# Patient Record
Sex: Female | Born: 1995 | Race: Asian | Hispanic: No | Smoking: Current every day smoker
Health system: Southern US, Community
[De-identification: ages and names within clinical notes are randomized; demographics above are authoritative.]

## PROBLEM LIST (undated history)

## (undated) DIAGNOSIS — L83 Acanthosis nigricans: Secondary | ICD-10-CM

## (undated) HISTORY — DX: Acanthosis nigricans: L83

---
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
## 2015-12-13 ENCOUNTER — Ambulatory Visit (INDEPENDENT_AMBULATORY_CARE_PROVIDER_SITE_OTHER): Payer: Self-pay | Admitting: Family Medicine

## 2015-12-13 VITALS — BP 112/74 | HR 99 | Temp 98.2°F | Resp 17 | Ht 68.69 in | Wt 181.0 lb

## 2015-12-13 DIAGNOSIS — R102 Pelvic and perineal pain: Secondary | ICD-10-CM | POA: Insufficient documentation

## 2015-12-13 LAB — POCT URINE PREGNANCY: Preg Test, Ur: NEGATIVE

## 2015-12-13 LAB — POC MICROSCOPIC URINALYSIS (UMFC): Mucus: ABSENT

## 2015-12-13 LAB — POCT WET + KOH PREP
Trich by wet prep: ABSENT
YEAST BY KOH: ABSENT
YEAST BY WET PREP: ABSENT

## 2015-12-13 LAB — POCT URINALYSIS DIP (MANUAL ENTRY)
Bilirubin, UA: NEGATIVE
GLUCOSE UA: NEGATIVE
Ketones, POC UA: NEGATIVE
Nitrite, UA: NEGATIVE
PROTEIN UA: NEGATIVE
SPEC GRAV UA: 1.015
UROBILINOGEN UA: 0.2
pH, UA: 7

## 2015-12-13 MED ORDER — METRONIDAZOLE 500 MG PO TABS: 500.0000 mg | ORAL_TABLET | Freq: Two times a day (BID) | ORAL | 0 refills | Status: DC

## 2015-12-13 NOTE — Progress Notes (Signed)
Patient ID: Elizabeth Hanson, female    DOB: [birth year], 20 y.o.   MRN: [id number]  PCP: No PCP Per Patient  Chief Complaint  Patient presents with  . OTHER    Pt c/o lower abd pain during and post intercourse x1week.     Subjective:   HPI 20 year old female presents for evaluation of painful intercourse times one week. She [facility]. Reports a sharp pain worst than cramping during and after intercourse. She is also experiencing pressure during the end of urination. Denies changes in urine color or change in odor. Reports noticing a yellowish discharge for about 1 week. She engages in unprotected sex with multiple partner. Reports last menstrual cycle was around October 21 st.  Social History   Social History  . Marital status: Single    Spouse name: N/A  . Number of children: N/A  . Years of education: N/A   Occupational History  . Not on file.   Social History Main Topics  . Smoking status: Never Smoker  . Smokeless tobacco: Not on file  . Alcohol use No  . Drug use: No  . Sexual activity: Yes    Birth control/ protection: Condom   Other Topics Concern  . Not on file   Social History Narrative  . No narrative on file   Family History  Problem Relation Age of Onset  . Breast cancer Mother   . Pancreatic cancer Paternal Grandmother    Review of Systems See HPI  Patient Active Problem List   Diagnosis Date Noted  . MDD (major depressive disorder), single episode, moderate (HCC) 09/25/2012  . Acanthosis nigricans 06/13/2012     Prior to Admission medications   Medication Sig Start Date End Date Taking? Authorizing Provider  cetirizine (ZYRTEC) 10 MG tablet Take 10 mg by mouth daily.   Yes Historical Provider, MD  fluconazole (DIFLUCAN) 100 MG tablet Take 1 tablet (100 mg total) by mouth once. Repeat if needed Patient not taking: Reported on 12/13/2015 10/08/12   Peyton Najjaravid H Hopper, MD   No Known Allergies     Objective:  Physical  Exam  Constitutional: She is oriented to person, place, and time. She appears well-developed and well-nourished.  HENT:  Head: Normocephalic and atraumatic.  Right Ear: External ear normal.  Left Ear: External ear normal.  Cardiovascular: Normal rate.   Pulmonary/Chest: Effort normal.  Abdominal: Soft. Bowel sounds are normal. There is no rebound and no guarding.  Genitourinary: Vaginal discharge found.  Genitourinary Comments: Normal female external genitalia without lesion. No inguinal lymphadenopathy. Vaginal mucosa is pink and moist without lesions. Cervix is closed with  thicken white discharge, not friable. Tenderness noted during vaginal exam in mid lower pelvic region. No offensive odor noted during exam.  Musculoskeletal: Normal range of motion.  Neurological: She is alert and oriented to person, place, and time.  Skin: Skin is warm and dry.  Psychiatric: She has a normal mood and affect. Her behavior is normal. Judgment and thought content normal.   Vitals:   12/13/15 0820  BP: 112/74  Pulse: 99  Resp: 17  Temp: 98.2 F (36.8 C)     Assessment & Plan:  1. Vaginal pain - POCT Microscopic Urinalysis ([facility]) - POCT urine pregnancy - POCT Wet + KOH Prep - POCT urinalysis dipstick - GC/Chlamydia Probe Am -Urine Culture  Plan: -Clue cells present on wet prep. Treat with Metronidazole 500 mg twice daily for 7 days. -Will follow-up regarding  Chlamydia and Gonorrhea results. [facility] as she has no insurance and would like to defer additional testing due to financial constraints.  Godfrey PickKimberly S. Tiburcio PeaHarris, MSN, FNP-C [facility]

## 2015-12-13 NOTE — Patient Instructions (Addendum)
I will contact you with your remaining  lab test results.  For bacterial vaginosis start Flagyl 500 mg twice daily for 7 days.  IF you received an x-ray today, you will receive an invoice from [facility]. Please contact [facility] at [phone] with questions or concerns regarding your invoice.   IF you received labwork today, you will receive an invoice from [facility]. Please contact [facility] at [phone] with questions or concerns regarding your invoice.   Our billing staff will not be able to assist you with questions regarding bills from these companies.  You will be contacted with the lab results as soon as they are available. The fastest way to get your results is to activate your My Chart account. Instructions are located on the last page of this paperwork. If you have not heard from us regarding the results in 2 weeks, please contact this office.      Bacterial Vaginosis Bacterial vaginosis is an infection of the vagina. It happens when too many germs (bacteria) grow in the vagina. This infection puts you at risk for infections from sex (STIs). Treating this infection can lower your risk for some STIs. You should also treat this if you are pregnant. It can cause your baby to be born early. Follow these instructions at home: Medicines  Take over-the-counter and prescription medicines only as told by your doctor.  Take or use your antibiotic medicine as told by your doctor. Do not stop taking or using it even if you start to feel better. General instructions  If you your sexual partner is a woman, tell her that you have this infection. She needs to get treatment if she has symptoms. If you have a female partner, he does not need to be treated.  During treatment:  Avoid sex.  Do not douche.  Avoid alcohol as told.  Avoid breastfeeding as told.  Drink enough fluid to keep your pee (urine) clear or pale yellow.  Keep your  vagina and butt (rectum) clean.  Wash the area with warm water every day.  Wipe from front to back after you use the toilet.  Keep all follow-up visits as told by your doctor. This is important. Preventing this condition  Do not douche.  Use only warm water to wash around your vagina.  Use protection when you have sex. This includes:  Latex condoms.  Dental dams.  Limit how many people you have sex with. It is best to only have sex with the same person (be monogamous).  Get tested for STIs. Have your partner get tested.  Wear underwear that is cotton or lined with cotton.  Avoid tight pants and pantyhose. This is most important in summer.  Do not use any products that have nicotine or tobacco in them. These include cigarettes and e-cigarettes. If you need help quitting, ask your doctor.  Do not use illegal drugs.  Limit how much alcohol you drink. Contact a doctor if:  Your symptoms do not get better, even after you are treated.  You have more discharge or pain when you pee (urinate).  You have a fever.  You have pain in your belly (abdomen).  You have pain with sex.  Your bleed from your vagina between periods. Summary  This infection happens when too many germs (bacteria) grow in the vagina.  Treating this condition can lower your risk for some infections from sex (STIs).  You should also treat this if you are pregnant. It can cause  early (premature) birth.  Do not stop taking or using your antibiotic medicine even if you start to feel better. This information is not intended to replace advice given to you by your health care provider. Make sure you discuss any questions you have with your health care provider. Document Released: 10/24/2007 Document Revised: 09/30/2015 Document Reviewed: 09/30/2015 Elsevier Interactive Patient Education  2017 [employer].

## 2015-12-14 ENCOUNTER — Telehealth: Payer: Self-pay | Admitting: Family Medicine

## 2015-12-14 LAB — GC/CHLAMYDIA PROBE AMP
CT PROBE, AMP APTIMA: DETECTED — AB
GC PROBE AMP APTIMA: NOT DETECTED

## 2015-12-14 MED ORDER — AZITHROMYCIN 250 MG PO TABS: ORAL_TABLET | ORAL | 0 refills | Status: DC

## 2015-12-14 NOTE — Telephone Encounter (Signed)
I left a generic voicemail advising of medication ordered for related to test results.   If patient calls back, her STD testing revealed a positive Chlamydia infection. I am ordering Azithromycin 1 gram. Patient will need to return in 3 months for retest.

## 2015-12-14 NOTE — Telephone Encounter (Signed)
Patient called back and was advised.  she also states that [facility] and given azithromyacin.

## 2015-12-15 ENCOUNTER — Other Ambulatory Visit: Payer: Self-pay | Admitting: Family Medicine

## 2015-12-15 DIAGNOSIS — N390 Urinary tract infection, site not specified: Secondary | ICD-10-CM

## 2015-12-15 LAB — URINE CULTURE

## 2015-12-15 MED ORDER — NITROFURANTOIN MONOHYD MACRO 100 MG PO CAPS: 100.0000 mg | ORAL_CAPSULE | Freq: Two times a day (BID) | ORAL | 0 refills | Status: DC

## 2015-12-15 NOTE — Progress Notes (Unsigned)
Please call patient to advise her urine culture grew out Staph infection. I am sending over a prescription for nitrofurantoin 100 mg twice daily for 10 days to clear infection. I would like for patient to return 1 week after completing antibiotic to have urine retested and cultured.  Thanks,  Joaquin CourtsKimberly Roberth Berling, FNP-C    This can be a lab visit only. I do not need to see her.  Thanks!

## 2015-12-18 ENCOUNTER — Telehealth: Payer: Self-pay | Admitting: Emergency Medicine

## 2015-12-18 NOTE — Progress Notes (Signed)
Pt given lab results with instructions to take prescribed antibiotics for Staph infection  States, she does not have insurance and wants to come in only for urine retest/cx  This should be fine or do you need to see her?

## 2015-12-25 ENCOUNTER — Ambulatory Visit (INDEPENDENT_AMBULATORY_CARE_PROVIDER_SITE_OTHER): Payer: Self-pay | Admitting: Physician Assistant

## 2015-12-25 DIAGNOSIS — R21 Rash and other nonspecific skin eruption: Secondary | ICD-10-CM

## 2015-12-25 DIAGNOSIS — B370 Candidal stomatitis: Secondary | ICD-10-CM

## 2015-12-25 LAB — POCT SKIN KOH: Skin KOH, POC: POSITIVE

## 2015-12-25 MED ORDER — CLOTRIMAZOLE 10 MG MT TROC: 10.0000 mg | Freq: Every day | OROMUCOSAL | 0 refills | Status: AC

## 2015-12-25 NOTE — Patient Instructions (Addendum)
Take clotrimazole troches for 14 days. If symptoms persist after this, please follow up. For rash on chest, use benedryl as needed for itching this should resolve over the next two weeks. If this gets worse or does not go away, follow up in 2 weeks. Thank you for letting me participate in your health and well being.    Oral Thrush, Adult Oral thrush is an infection in your mouth and throat. It causes white patches on your tongue and in your mouth. Follow these instructions at home: Helping with soreness  To lessen your pain:  Drink cold liquids, like water and iced tea.  Eat frozen ice pops or frozen juices.  Eat foods that are easy to swallow, like gelatin and ice cream.  Drink from a straw if the patches in your mouth are painful. General instructions  Take or use over-the-counter and prescription medicines only as told by your doctor. Medicine for oral thrush may be something to swallow, or it may be something to put on the infected area.  Eat plain yogurt that has live cultures in it. Read the label to make sure.  If you wear dentures:  Take out your dentures before you go to bed.  Brush them well.  Soak them in a denture cleaner.  Rinse your mouth with warm salt-water many times a day. To make the salt-water mixture, completely dissolve 1/2-1 teaspoon of salt in 1 cup of warm water. Contact a doctor if:  Your problems are getting worse.  Your problems do not get better in less than 7 days with treatment.  Your infection is spreading. This may show as white patches on the skin outside of your mouth.  You are nursing your baby and you have redness and pain in the nipples. This information is not intended to replace advice given to you by your health care provider. Make sure you discuss any questions you have with your health care provider. Document Released: 04/10/2009 Document Revised: 10/09/2015 Document Reviewed: 10/09/2015 Elsevier Interactive Patient Education   2017 [employer]

## 2015-12-25 NOTE — Progress Notes (Signed)
MRN: [id number] DOB: 01/29/1996  Subjective:   Elizabeth Hanson is a 20 y.o. female presenting for chief complaint of follow up on vaginal issues. Notes that all of her symptoms have resolved. Denies urinary frequency, urinary urgency, dysuria, urinary hesitancy, vaginal discharge,and  vaginal itching.  Pt originally seen in our office on 12/13/15 for dyspareunia and vaginal discharge. Her wet prep showed clue cells. Her urinalysis showed leukocytes and WBCs. And her GC/Chlamydia probe was positive for chlamydia. Pt was treated with 7 day course of flagyl for BV, 1g of azithromycin for chlamydia, and macrobid for UTI.   Of note, pt is concerned about new rash on chest and arms. Notes that this came about 2 days after taking the azithromycin and flagyl. 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 It had associated itching but that has since resolved. Denies pain, trouble swallowing, difficulty breathing and facial swelling. Has not tried anything for relief. Denies new detergent, lotions, soaps, or plant exposure.  She is also having a lot of white stuff on her tongue x 4-5 days. She tried to brush it off with no success.   Of note, she was tested [facility] last week after our office visit because she has no insurance but she has not received her results yet.  Elizabeth Hanson has a current medication list which includes the following prescription(s): azithromycin, cetirizine, metronidazole, and nitrofurantoin (macrocrystal-monohydrate). Also has No Known Allergies.  Elizabeth Hanson  has a past medical history of Acanthosis nigricans. Also  has no past surgical history on file.   Objective:   Vitals: LMP 11/18/2015   Physical Exam  Constitutional: She is oriented to person, place, and time. She appears well-developed and well-nourished.  HENT:  Head: Normocephalic and atraumatic.    Mouth/Throat: Uvula is midline and mucous membranes are normal. Posterior oropharyngeal erythema present.  Creamy white lesions noted on soft palate and tongue. They are easily scraped off with tongue depressor.   Eyes: Conjunctivae are normal.  Neck: Normal range of motion.  Pulmonary/Chest: Effort normal.  Lymphadenopathy:       Head (right side): No submental, no submandibular, no tonsillar, no preauricular, no posterior auricular and no occipital adenopathy present.       Head (left side): No submental, no submandibular, no tonsillar, no preauricular, no posterior auricular and no occipital adenopathy present.    She has no cervical adenopathy.       Right: No supraclavicular adenopathy present.       Left: No supraclavicular adenopathy present.  Neurological: She is alert and oriented to person, place, and time.  Skin: Skin is warm and dry.  Diffuse maculopapular rash on anterior trunk and arms bilaterally.   Psychiatric: She has a normal mood and affect.  Vitals reviewed.    Results for orders placed or performed in visit on 12/25/15 (from the past 24 hour(s))  POCT Skin KOH     Status: None   Collection Time: 12/25/15 10:15 AM  Result Value Ref Range   Skin KOH, POC Positive    Assessment and Plan :   1. Rash and nonspecific skin eruption -Resolving. Informed pt that I am not going to add azithromycin to the allergy list at this itme as she did eat snow crab the day before the rash appeared and has had a history of seafood allergies but she should  inform future providers that she may have had a reaction to azithromycin. Pt understands. Instructed to follow up in 2 weeks if rash has not completely resolved. Follow up sooner if symptoms worsen.   2. Oral thrush - POCT Skin KOH - clotrimazole (MYCELEX) 10 MG troche; Take 1 tablet (10 mg total) by mouth 5 (five) times daily.  Dispense: 70 tablet; Refill: 0   Benjiman CoreBrittany Jackquline Branca, PA-C [facility] [phone] 12/25/2015 10:15 AM

## 2016-01-11 ENCOUNTER — Ambulatory Visit (INDEPENDENT_AMBULATORY_CARE_PROVIDER_SITE_OTHER): Payer: Self-pay | Admitting: Physician Assistant

## 2016-01-11 VITALS — BP 126/74 | HR 91 | Temp 98.1°F | Ht 63.5 in | Wt 180.1 lb

## 2016-01-11 DIAGNOSIS — R21 Rash and other nonspecific skin eruption: Secondary | ICD-10-CM

## 2016-01-11 DIAGNOSIS — N941 Unspecified dyspareunia: Secondary | ICD-10-CM

## 2016-01-11 DIAGNOSIS — B370 Candidal stomatitis: Secondary | ICD-10-CM

## 2016-01-11 LAB — POCT SKIN KOH: SKIN KOH, POC: NEGATIVE

## 2016-01-11 LAB — POCT URINALYSIS DIP (MANUAL ENTRY)
BILIRUBIN UA: NEGATIVE
BILIRUBIN UA: NEGATIVE
GLUCOSE UA: NEGATIVE
LEUKOCYTES UA: NEGATIVE
Nitrite, UA: NEGATIVE
Protein Ur, POC: NEGATIVE
Spec Grav, UA: >=1.03
Urobilinogen, UA: 0.2
pH, UA: 6

## 2016-01-11 LAB — POCT CBC
Granulocyte percent: 4.4 %G — AB (ref 37–80)
HCT, POC: 39.6 % (ref 37.7–47.9)
HEMOGLOBIN: 14.2 g/dL (ref 12.2–16.2)
Lymph, poc: 39.3 — AB (ref 0.6–3.4)
MCH: 29.6 pg (ref 27–31.2)
MCHC: 35.8 g/dL — AB (ref 31.8–35.4)
MCV: 82.6 fL (ref 80–97)
MID (cbc): 52.2 — AB (ref 0–0.9)
MPV: 7.2 fL (ref 0–99.8)
POC GRANULOCYTE: 0.7 — AB (ref 2–6.9)
POC LYMPH PERCENT: 8.5 %L — AB (ref 10–50)
POC MID %: 3.3 %M (ref 0–12)
Platelet Count, POC: 242 10*3/uL (ref 142–424)
RBC: 4.8 M/uL (ref 4.04–5.48)
RDW, POC: 14.7 %
WBC: 8.5 10*3/uL (ref 4.6–10.2)

## 2016-01-11 LAB — POCT URINE PREGNANCY: PREG TEST UR: NEGATIVE

## 2016-01-11 LAB — POCT WET + KOH PREP
Trich by wet prep: ABSENT
YEAST BY KOH: ABSENT
Yeast by wet prep: ABSENT

## 2016-01-11 LAB — POC MICROSCOPIC URINALYSIS (UMFC): MUCUS RE: ABSENT

## 2016-01-11 MED ORDER — FLUCONAZOLE 100 MG PO TABS: ORAL_TABLET | ORAL | 0 refills | Status: DC

## 2016-01-11 NOTE — Progress Notes (Signed)
Elizabeth Hanson  MRN: [id number] DOB: 1995/07/08  Subjective:  Elizabeth Hanson is a 20 y.o. female seen in office today for a chief complaint of bilateral lower abdominal pain x 1.5 weeks. It is described as a cramping/throbbing sensation during and after intercourse. Has associated vaginal itching. Denies bleeding after intercourse. Her LMP was 12/14/15. Her last bowel movement was this morning and was normal consistency. She states it is similar to the time she had chlamydia. She was treated for chlamydia 4 weeks ago. She has had one sexual partner since she was last treated for chlamydia. She has used condoms every time they have had sex. Of note, pt does also state that this is similar to her period cramps that occur typically the week prior to her menstrual cycle.   Pt is also still experiencing oral thrush. Was seen on 12/25/15 for this issue and treated with oral clotrimazole for 2 weeks. Notes that it has gotten much better but is still slightly there. HIV test one week ago was negative from health department.   Review of Systems  Constitutional: Positive for chills (the past few days). Negative for diaphoresis, fatigue and fever.  HENT: Positive for rhinorrhea and sore throat. Negative for congestion and sinus pressure.   Gastrointestinal: Negative for blood in stool, constipation, diarrhea, nausea and vomiting.  Genitourinary: Positive for genital sores (she has noticed bumps on the vagina). Negative for decreased urine volume, difficulty urinating, dysuria, flank pain, frequency, hematuria, urgency and vaginal discharge.  Neurological: Positive for headaches.    Patient Active Problem List   Diagnosis Date Noted  . Vaginal pain 12/13/2015  . MDD (major depressive disorder), single episode, moderate (HCC) 09/25/2012  . Acanthosis nigricans 06/13/2012    Current Outpatient Prescriptions on File Prior to Visit  Medication Sig Dispense Refill  . azithromycin (ZITHROMAX) 250 MG tablet Take  all four tablets at once (Patient not taking: Reported on 01/11/2016) 4 tablet 0  . cetirizine (ZYRTEC) 10 MG tablet Take 10 mg by mouth daily.    . metroNIDAZOLE (FLAGYL) 500 MG tablet Take 1 tablet (500 mg total) by mouth 2 (two) times daily with a meal. DO NOT CONSUME ALCOHOL WHILE TAKING THIS MEDICATION. (Patient not taking: Reported on 01/11/2016) 14 tablet 0  . nitrofurantoin, macrocrystal-monohydrate, (MACROBID) 100 MG capsule Take 1 capsule (100 mg total) by mouth 2 (two) times daily. (Patient not taking: Reported on 01/11/2016) 20 capsule 0   No current facility-administered medications on file prior to visit.     No Known Allergies      Objective:  BP 126/74 (BP Location: Right Arm, Patient Position: Sitting, Cuff Size: Small)   Pulse 91   Temp 98.1 F (36.7 C) (Oral)   Ht 5' 3.5" (1.613 m)   Wt 180 lb 1.6 oz (81.7 kg)   LMP 12/19/2015 (Approximate)   SpO2 99%   BMI 31.40 kg/m   Physical Exam  Constitutional: She is oriented to person, place, and time and well-developed, well-nourished, and in no distress.  HENT:  Head: Normocephalic and atraumatic.  Nose: Mucosal edema and rhinorrhea present.  Mouth/Throat: Uvula is midline and mucous membranes are normal. Posterior oropharyngeal erythema present.  Creamy white lesions noted on tongue, easily scraped off with tongue depressor.   Eyes: Conjunctivae are normal.  Neck: Normal range of motion.  Pulmonary/Chest: Effort normal.  Abdominal: Soft. Normal appearance and bowel sounds are normal. There is tenderness (minimal) in the right lower quadrant, suprapubic area and left lower quadrant. There is  no rigidity, no guarding, no CVA tenderness, no tenderness at McBurney's point and negative Murphy's sign.  Genitourinary: Uterus normal, cervix normal, right adnexa normal, left adnexa normal and vulva normal. Cervix exhibits no motion tenderness and no tenderness. Right adnexum displays no mass and no tenderness. Left adnexum  displays no mass and no tenderness. Vagina exhibits normal mucosa. Thick  odorless  white and vaginal discharge (scant) found.  Neurological: She is alert and oriented to person, place, and time. Gait normal.  Skin: Skin is warm and dry.  Psychiatric: Affect normal.  Vitals reviewed.  Results for orders placed or performed in visit on 01/11/16 (from the past 24 hour(s))  POCT urinalysis dipstick     Status: Abnormal   Collection Time: 01/11/16 10:49 AM  Result Value Ref Range   Color, UA yellow yellow   Clarity, UA clear clear   Glucose, UA negative negative   Bilirubin, UA negative negative   Ketones, POC UA negative negative   Spec Grav, UA >=1.030    Blood, UA trace-intact (A) negative   pH, UA 6.0    Protein Ur, POC negative negative   Urobilinogen, UA 0.2    Nitrite, UA Negative Negative   Leukocytes, UA Negative Negative  POCT urine pregnancy     Status: None   Collection Time: 01/11/16 10:50 AM  Result Value Ref Range   Preg Test, Ur Negative Negative  POCT Wet + KOH Prep     Status: Abnormal   Collection Time: 01/11/16 10:50 AM  Result Value Ref Range   Yeast by KOH Absent Absent   Yeast by wet prep Absent Absent   WBC by wet prep None (A) Few   Clue Cells Wet Prep HPF POC None None   Trich by wet prep Absent Absent   Bacteria Wet Prep HPF POC Many (A) Few   Epithelial Cells By [employer] Pref ([facility]) Moderate (A) None, Few, Too numerous to count   RBC,UR,HPF,POC None None RBC/hpf  POCT CBC     Status: Abnormal   Collection Time: 01/11/16 10:58 AM  Result Value Ref Range   WBC 8.5 4.6 - 10.2 K/uL   Lymph, poc 39.3 (A) 0.6 - 3.4   POC LYMPH PERCENT 8.5 (A) 10 - 50 %L   MID (cbc) 52.2 (A) 0 - 0.9   POC MID % 3.3 0 - 12 %M   POC Granulocyte 0.7 (A) 2 - 6.9   Granulocyte percent 4.4 (A) 37 - 80 %G   RBC 4.80 4.04 - 5.48 M/uL   Hemoglobin 14.2 12.2 - 16.2 g/dL   HCT, POC 16.1 09.6 - 47.9 %   MCV 82.6 80 - 97 fL   MCH, POC 29.6 27 - 31.2 pg   MCHC 35.8 (A) 31.8 - 35.4  g/dL   RDW, POC 04.5 %   Platelet Count, POC 242 142 - 424 K/uL   MPV 7.2 0 - 99.8 fL  POCT Microscopic Urinalysis ([facility])     Status: Abnormal   Collection Time: 01/11/16 10:59 AM  Result Value Ref Range   WBC,UR,HPF,POC None None WBC/hpf   RBC,UR,HPF,POC None None RBC/hpf   Bacteria None None, Too numerous to count   Mucus Absent Absent   Epithelial Cells, UR Per Microscopy Few (A) None, Too numerous to count cells/hpf    Assessment and Plan :  1. Dyspareunia in female Await lab results, instructed if GC/Chlamydia negative to follow up after her menstrual cycle if pain is still present. If symptoms worsen,  seek care sooner.  - POCT urinalysis dipstick - POCT Microscopic Urinalysis ([facility]) - POCT urine pregnancy - POCT Wet + KOH Prep - POCT CBC - GC/Chlamydia Probe Amp  2. Rash and nonspecific skin eruption - POCT Skin KOH  3. Oral candidiasis Pt has failed topical treatment, will treat with diflucan  - fluconazole (DIFLUCAN) 100 MG tablet; Take 2 tablets on day one then 1 tablet x 7 days.  Dispense: 9 tablet; Refill: 0   Benjiman CoreBrittany Charrise Lardner PA-C  [facility] 01/11/2016 11:31 AM

## 2016-01-11 NOTE — Patient Instructions (Signed)
We will contact you with your lab results.   For the oral thrush: take medication as prescribed, this should also help with vaginal itching.  Please return if your abdominal discomfort persists after your cycle. If symptoms worsen, or you develop fever, chills, or other concerning symptoms, seek care immediately.  Thank you for letting me participate in your health and well being.

## 2016-01-13 LAB — GC/CHLAMYDIA PROBE AMP
Chlamydia trachomatis, NAA: NEGATIVE
Neisseria gonorrhoeae by PCR: NEGATIVE

## 2016-01-19 ENCOUNTER — Telehealth: Payer: Self-pay

## 2016-01-19 NOTE — Telephone Encounter (Signed)
PATIENT STATES SHE SAW BRITTANY WISEMAN ABOUT A WEEK AGO FOR ABDOMINAL PAIN. BRITTANY DID A URINE CULTURE AND TOLD HER THE RESULTS WOULD BE BACK IN 3 BUSINESS DAYS. SHE HAS NOT HEARD ANYTHING BACK FROM US. BEST PHONE [phone] (CELL)  PHARMACY CHOICE IS [facility] ON [address].  MBC

## 2016-01-23 NOTE — Telephone Encounter (Signed)
Please see results and advise.

## 2016-01-24 NOTE — Telephone Encounter (Signed)
Pt advised.

## 2016-01-24 NOTE — Telephone Encounter (Signed)
Pt may have misunderstood. I did not send a urine culture as she did not have anything in her urine suggestive of needing a urine culture. However her GC/Chlamydia testing was negative. Please let me know if she has any questions. Thanks!

## 2016-01-30 ENCOUNTER — Ambulatory Visit (INDEPENDENT_AMBULATORY_CARE_PROVIDER_SITE_OTHER): Payer: BLUE CROSS/BLUE SHIELD | Admitting: Physician Assistant

## 2016-01-30 VITALS — BP 132/72 | HR 87 | Temp 98.2°F | Resp 18 | Ht 63.5 in | Wt 173.0 lb

## 2016-01-30 DIAGNOSIS — R0981 Nasal congestion: Secondary | ICD-10-CM

## 2016-01-30 DIAGNOSIS — Z8719 Personal history of other diseases of the digestive system: Secondary | ICD-10-CM

## 2016-01-30 DIAGNOSIS — R059 Cough, unspecified: Secondary | ICD-10-CM

## 2016-01-30 DIAGNOSIS — M940 Chondrocostal junction syndrome [Tietze]: Secondary | ICD-10-CM

## 2016-01-30 DIAGNOSIS — R05 Cough: Secondary | ICD-10-CM

## 2016-01-30 LAB — POCT SKIN KOH: SKIN KOH, POC: NEGATIVE

## 2016-01-30 MED ORDER — HYDROCOD POLST-CPM POLST ER 10-8 MG/5ML PO SUER: 5.0000 mL | Freq: Two times a day (BID) | ORAL | 0 refills | Status: DC | PRN

## 2016-01-30 MED ORDER — BENZONATATE 100 MG PO CAPS: 100.0000 mg | ORAL_CAPSULE | Freq: Three times a day (TID) | ORAL | 0 refills | Status: DC | PRN

## 2016-01-30 MED ORDER — FLUTICASONE PROPIONATE 50 MCG/ACT NA SUSP: 2.0000 | Freq: Every day | NASAL | 0 refills | Status: DC

## 2016-01-30 MED ORDER — MELOXICAM 15 MG PO TABS: 15.0000 mg | ORAL_TABLET | Freq: Every day | ORAL | 1 refills | Status: DC

## 2016-01-30 NOTE — Progress Notes (Signed)
MRN: [id number] DOB: 1995-11-07  Subjective:   Elizabeth Hanson is a [age] y.o. female presenting for chief complaint of Sore Throat and Shortness of Breath .  Reports 4 day  history of sinus headache, sinus congestion, rhinorrhea, sore throat and dry cough (no hemoptysis). [place] to [place] for the holidays and felt like this once she returned. She is now having pain under ribs when she coughs sometimes. Has tried dayquil with mild relief. Denies fever, ear pain, wheezing, shortness of breath, chest tightness and myalgia, night sweats, chills, nausea, vomiting, abdominal pain and diarrhea. Has not had  sick contact with anyone. No history of seasonal allergies, no history of asthma. Patient has had flu shot this season. No smoking, occasional alcohol. Denies any other aggravating or relieving factors, no other questions or concerns.  Elizabeth Hanson currently has no medications in their medication list. Also has No Known Allergies.  Elizabeth Hanson  has a past medical history of Acanthosis nigricans. Also  has no past surgical history on file.   Objective:   Vitals: BP 132/72 (BP Location: Right Arm, Patient Position: Sitting, Cuff Size: Small)   Pulse 87   Temp 98.2 F (36.8 C) (Oral)   Resp 18   Ht 5' 3.5" (1.613 m)   Wt 173 lb (78.5 kg)   LMP 01/14/2016   SpO2 98%   BMI 30.16 kg/m   Physical Exam  Constitutional: She is oriented to person, place, and time. She appears well-developed and well-nourished.  HENT:  Head: Normocephalic and atraumatic.  Right Ear: Tympanic membrane, external ear and ear canal normal.  Left Ear: Tympanic membrane, external ear and ear canal normal.  Nose: Mucosal edema present. Right sinus exhibits no maxillary sinus tenderness and no frontal sinus tenderness. Left sinus exhibits no maxillary sinus tenderness and no frontal sinus tenderness.  Mouth/Throat: Uvula is midline, oropharynx is clear and moist and mucous membranes are normal.  Eyes: Conjunctivae are  normal.  Neck: Normal range of motion.  Cardiovascular: Normal rate, regular rhythm and normal heart sounds.   Pulmonary/Chest: Effort normal and breath sounds normal.    Lymphadenopathy:       Head (right side): No submental, no submandibular, no tonsillar, no preauricular, no posterior auricular and no occipital adenopathy present.       Head (left side): No submental, no submandibular, no tonsillar, no preauricular, no posterior auricular and no occipital adenopathy present.    She has no cervical adenopathy.       Right: No supraclavicular adenopathy present.       Left: No supraclavicular adenopathy present.  Neurological: She is alert and oriented to person, place, and time.  Skin: Skin is warm and dry.  Psychiatric: She has a normal mood and affect.  Vitals reviewed.   Results for orders placed or performed in visit on 01/30/16 (from the past 24 hour(s))  POCT Skin KOH     Status: None   Collection Time: 01/30/16  4:32 PM  Result Value Ref Range   Skin KOH, POC Negative     Assessment and Plan :  Acute respiratory infection is likely due to viral etiology leading to costochondritis from excessive coughing. Will treat symptomatically. Pt instructed to return to clinic if symptoms worsen, do not improve in one week, or as needed  1. Costochondritis - meloxicam (MOBIC) 15 MG tablet; Take 1 tablet (15 mg total) by mouth daily.  Dispense: 30 tablet; Refill: 1 2. Cough - benzonatate (TESSALON) 100 MG capsule; Take 1-2 capsules (  100-200 mg total) by mouth 3 (three) times daily as needed for cough.  Dispense: 40 capsule; Refill: 0 - chlorpheniramine-HYDROcodone (TUSSIONEX PENNKINETIC ER) 10-8 MG/5ML SUER; Take 5 mLs by mouth every 12 (twelve) hours as needed for cough.  Dispense: 100 mL; Refill: 0 3. History of oral lesions -Resolved  - POCT Skin KOH 4. Sinus congestion - fluticasone (FLONASE) 50 MCG/ACT nasal spray; Place 2 sprays into both nostrils daily.  Dispense: 16 g;  Refill: 0  Benjiman Core, PA-C  [facility] 01/30/2016 4:35 PM

## 2016-01-30 NOTE — Patient Instructions (Addendum)
- We will treat this as a respiratory viral infection and as costochondritis.  - For costochondritis, take meloxicam daily for the next week. Use ice to the affected area.  - I recommend you rest, drink plenty of fluids, eat light meals including soups.  - You may use flonase and OTC 12 hr sudafed for your sinus congestion. You may use cough syrup at night for your cough and sore throat, Tessalon pearls during the day. Be aware that cough syrup can definitely make you drowsy and sleepy so do not drive or operate any heavy machinery if it is affecting you during the day.  - You may also use Tylenol or ibuprofen over-the-counter for your sore throat.  - Please let me know if you are not seeing any improvement or get worse in 7 days.     Cough, Adult Introduction A cough helps to clear your throat and lungs. A cough may last only 2-3 weeks (acute), or it may last longer than 8 weeks (chronic). Many different things can cause a cough. A cough may be a sign of an illness or another medical condition. Follow these instructions at home:  Pay attention to any changes in your cough.  Take medicines only as told by your doctor.  If you were prescribed an antibiotic medicine, take it as told by your doctor. Do not stop taking it even if you start to feel better.  Talk with your doctor before you try using a cough medicine.  Drink enough fluid to keep your pee (urine) clear or pale yellow.  If the air is dry, use a cold steam vaporizer or humidifier in your home.  Stay away from things that make you cough at work or at home.  If your cough is worse at night, try using extra pillows to raise your head up higher while you sleep.  Do not smoke, and try not to be around smoke. If you need help quitting, ask your doctor.  Do not have caffeine.  Do not drink alcohol.  Rest as needed. Contact a doctor if:  You have new problems (symptoms).  You cough up yellow fluid (pus).  Your cough does  not get better after 2-3 weeks, or your cough gets worse.  Medicine does not help your cough and you are not sleeping well.  You have pain that gets worse or pain that is not helped with medicine.  You have a fever.  You are losing weight and you do not know why.  You have night sweats. Get help right away if:  You cough up blood.  You have trouble breathing.  Your heartbeat is very fast. This information is not intended to replace advice given to you by your health care provider. Make sure you discuss any questions you have with your health care provider. Document Released: 09/27/2010 Document Revised: 06/22/2015 Document Reviewed: 03/23/2014  2017 Elsevier  Costochondritis Costochondritis is swelling and irritation (inflammation) of the tissue (cartilage) that connects your ribs to your breastbone (sternum). This causes pain in the front of your chest. Usually, the pain:  Starts gradually.  Is in more than one rib. This condition usually goes away on its own over time. Follow these instructions at home:  Do not do anything that makes your pain worse.  If directed, put ice on the painful area:  Put ice in a plastic bag.  Place a towel between your skin and the bag.  Leave the ice on for 20 minutes, 2-3 times a  day.  If directed, put heat on the affected area as often as told by your doctor. Use the heat source that your doctor tells you to use, such as a moist heat pack or a heating pad.  Place a towel between your skin and the heat source.  Leave the heat on for 20-30 minutes.  Take off the heat if your skin turns bright red. This is very important if you cannot feel pain, heat, or cold. You may have a greater risk of getting burned.  Take over-the-counter and prescription medicines only as told by your doctor.  Return to your normal activities as told by your doctor. Ask your doctor what activities are safe for you.  Keep all follow-up visits as told by your  doctor. This is important. Contact a doctor if:  You have chills or a fever.  Your pain does not go away or it gets worse.  You have a cough that does not go away. Get help right away if:  You are short of breath. This information is not intended to replace advice given to you by your health care provider. Make sure you discuss any questions you have with your health care provider. Document Released: 07/03/2007 Document Revised: 08/04/2015 Document Reviewed: 05/10/2015 Elsevier Interactive Patient Education  2017 [employer]   IF you received an x-ray today, you will receive an invoice from [facility]. Please contact [facility] at [phone] with questions or concerns regarding your invoice.   IF you received labwork today, you will receive an invoice from [facility]. Please contact [facility] at [phone] with questions or concerns regarding your invoice.   Our billing staff will not be able to assist you with questions regarding bills from these companies.  You will be contacted with the lab results as soon as they are available. The fastest way to get your results is to activate your My Chart account. Instructions are located on the last page of this paperwork. If you have not heard from us regarding the results in 2 weeks, please contact this office.

## 2019-02-03 ENCOUNTER — Ambulatory Visit (HOSPITAL_COMMUNITY)
Admission: EM | Admit: 2019-02-03 | Discharge: 2019-02-03 | Disposition: A | Discharge status: Home / Self Care | Payer: 59 | Attending: Family Medicine | Admitting: Family Medicine

## 2019-02-03 ENCOUNTER — Other Ambulatory Visit: Payer: Self-pay

## 2019-02-03 ENCOUNTER — Encounter (HOSPITAL_COMMUNITY): Payer: Self-pay

## 2019-02-03 DIAGNOSIS — F1290 Cannabis use, unspecified, uncomplicated: Secondary | ICD-10-CM | POA: Diagnosis not present

## 2019-02-03 DIAGNOSIS — J069 Acute upper respiratory infection, unspecified: Secondary | ICD-10-CM

## 2019-02-03 DIAGNOSIS — Z7251 High risk heterosexual behavior: Secondary | ICD-10-CM

## 2019-02-03 DIAGNOSIS — Z3202 Encounter for pregnancy test, result negative: Secondary | ICD-10-CM

## 2019-02-03 DIAGNOSIS — R070 Pain in throat: Secondary | ICD-10-CM | POA: Diagnosis not present

## 2019-02-03 DIAGNOSIS — L83 Acanthosis nigricans: Secondary | ICD-10-CM | POA: Insufficient documentation

## 2019-02-03 DIAGNOSIS — R05 Cough: Secondary | ICD-10-CM

## 2019-02-03 DIAGNOSIS — Z20822 Contact with and (suspected) exposure to covid-19: Secondary | ICD-10-CM | POA: Diagnosis not present

## 2019-02-03 DIAGNOSIS — R059 Cough, unspecified: Secondary | ICD-10-CM

## 2019-02-03 LAB — POCT PREGNANCY, URINE: Preg Test, Ur: NEGATIVE

## 2019-02-03 LAB — POCT URINALYSIS DIP (DEVICE)
Bilirubin Urine: NEGATIVE
Glucose, UA: NEGATIVE mg/dL
Hgb urine dipstick: NEGATIVE
Ketones, ur: NEGATIVE mg/dL
Leukocytes,Ua: NEGATIVE
Nitrite: NEGATIVE
Protein, ur: NEGATIVE mg/dL
Specific Gravity, Urine: 1.025 (ref 1.005–1.030)
Urobilinogen, UA: 0.2 mg/dL (ref 0.0–1.0)
pH: 7.5 (ref 5.0–8.0)

## 2019-02-03 LAB — POC URINE PREG, ED: Preg Test, Ur: NEGATIVE

## 2019-02-03 LAB — POCT RAPID STREP A: Streptococcus, Group A Screen (Direct): NEGATIVE

## 2019-02-03 MED ORDER — PROMETHAZINE-DM 6.25-15 MG/5ML PO SYRP: 5.0000 mL | ORAL_SOLUTION | Freq: Every evening | ORAL | 0 refills | Status: DC | PRN

## 2019-02-03 MED ORDER — BENZONATATE 100 MG PO CAPS: 100.0000 mg | ORAL_CAPSULE | Freq: Three times a day (TID) | ORAL | 0 refills | Status: DC | PRN

## 2019-02-03 NOTE — Discharge Instructions (Addendum)
Will manage this as a viral syndrome. For sore throat or cough try using a honey-based tea. Use 3 teaspoons of honey with juice squeezed from half lemon. Place shaved pieces of ginger into 1/2-1 cup of water and warm over stove top. Then mix the ingredients and repeat every 4 hours as needed. Please take Tylenol 500mg every 6 hours. Hydrate very well with at least 2 liters of water. Eat light meals such as soups to replenish electrolytes and soft fruits, veggies. Start an antihistamine like Zyrtec (cetirizine) at 10mg daily for postnasal drainage, sinus congestion.  You can take this together with pseudoephedrine (Sudafed) at a dose of 60 mg 3 times a day or twice daily as needed for the same kind of congestion.   

## 2019-02-03 NOTE — ED Provider Notes (Signed)
Elizabeth Hanson   MRN: [id number] DOB: 26-Jun-1995  Subjective:   Elizabeth Hanson is a 24 y.o. female presenting for 1 week hx of dry cough, throat pain, right tonsil swelling. Smokes marijuana twice a week. Denies smoking cigarettes. Denies hx of asthma. Has used Robitussin, NyQuil.  Would like to get testing for STI.  Denies taking chronic medications.  No Known Allergies  Past Medical History:  Diagnosis Date  . Acanthosis nigricans      History reviewed. No pertinent surgical history.  Family History  Problem Relation Age of Onset  . Breast cancer Mother   . Pancreatic cancer Paternal Grandmother     Social History   Tobacco Use  . Smoking status: Never Smoker  . Smokeless tobacco: Never Used  Substance Use Topics  . Alcohol use: No  . Drug use: Yes    Types: Marijuana    Comment: 2 or 3 times a week    Review of Systems  Constitutional: Negative for fever and malaise/fatigue.  HENT: Positive for sore throat. Negative for congestion, ear pain and sinus pain.   Eyes: Negative for discharge and redness.  Respiratory: Positive for cough, shortness of breath (mild) and wheezing (mild). Negative for hemoptysis.   Cardiovascular: Negative for chest pain.  Gastrointestinal: Negative for abdominal pain, diarrhea, nausea and vomiting.  Genitourinary: Negative for dysuria, flank pain and hematuria.  Musculoskeletal: Negative for myalgias.  Skin: Negative for rash.  Neurological: Negative for dizziness, weakness and headaches.  Psychiatric/Behavioral: Negative for depression and substance abuse.     Objective:   Vitals: BP 130/85 (BP Location: Right Arm)   Pulse 96   Temp 98.9 F (37.2 C) (Oral)   Resp 16   Wt 149 lb (67.6 kg)   LMP 01/16/2019   SpO2 99%   BMI 25.98 kg/m   Physical Exam Constitutional:      General: She is not in acute distress.    Appearance: Normal appearance. She is well-developed. She is not ill-appearing, toxic-appearing or  diaphoretic.  HENT:     Head: Normocephalic and atraumatic.     Nose: Nose normal.     Mouth/Throat:     Mouth: Mucous membranes are moist.     Pharynx: Posterior oropharyngeal erythema (mild over tonsils bilaterally) present. No oropharyngeal exudate.  Eyes:     General: No scleral icterus.       Right eye: No discharge.        Left eye: No discharge.     Extraocular Movements: Extraocular movements intact.     Pupils: Pupils are equal, round, and reactive to light.  Cardiovascular:     Rate and Rhythm: Normal rate and regular rhythm.     Pulses: Normal pulses.     Heart sounds: Normal heart sounds. No murmur. No friction rub. No gallop.   Pulmonary:     Effort: Pulmonary effort is normal. No respiratory distress.     Breath sounds: Normal breath sounds. No stridor. No wheezing, rhonchi or rales.  Skin:    General: Skin is warm and dry.     Findings: No rash.  Neurological:     General: No focal deficit present.     Mental Status: She is alert and oriented to person, place, and time.  Psychiatric:        Mood and Affect: Mood normal.        Behavior: Behavior normal.        Thought Content: Thought content normal.  Judgment: Judgment normal.     Results for orders placed or performed during the hospital encounter of 02/03/19 (from the past 24 hour(s))  POCT urinalysis dip (device)     Status: None   Collection Time: 02/03/19  3:45 PM  Result Value Ref Range   Glucose, UA NEGATIVE NEGATIVE mg/dL   Bilirubin Urine NEGATIVE NEGATIVE   Ketones, ur NEGATIVE NEGATIVE mg/dL   Specific Gravity, Urine 1.025 1.005 - 1.030   Hgb urine dipstick NEGATIVE NEGATIVE   pH 7.5 5.0 - 8.0   Protein, ur NEGATIVE NEGATIVE mg/dL   Urobilinogen, UA 0.2 0.0 - 1.0 mg/dL   Nitrite NEGATIVE NEGATIVE   Leukocytes,Ua NEGATIVE NEGATIVE  POC urine pregnancy     Status: None   Collection Time: 02/03/19  3:53 PM  Result Value Ref Range   Preg Test, Ur NEGATIVE NEGATIVE  Pregnancy, urine POC      Status: None   Collection Time: 02/03/19  3:53 PM  Result Value Ref Range   Preg Test, Ur NEGATIVE NEGATIVE  POCT rapid strep A [facility])     Status: None   Collection Time: 02/03/19  3:53 PM  Result Value Ref Range   Streptococcus, Group A Screen (Direct) NEGATIVE NEGATIVE    Assessment and Plan :   1. Viral URI with cough   2. Cough   3. Unprotected sex   4. Throat pain     Patient refused not for work. Will manage for viral illness such as viral URI, viral rhinitis, possible COVID-19. Counseled patient on nature of COVID-19 including modes of transmission, diagnostic testing, management and supportive care.  Offered symptomatic relief. COVID 19, STI testing is pending. Counseled patient on potential for adverse effects with medications prescribed/recommended today, ER and return-to-clinic precautions discussed, patient verbalized understanding.     Wallis Bamberg, [place] 02/03/19 [phone]

## 2019-02-03 NOTE — ED Notes (Signed)
Bed: UC07 Expected date:  Expected time:  Means of arrival:  Comments: Hold Appt 1500

## 2019-02-03 NOTE — ED Triage Notes (Addendum)
Pt states she has had some pelvis pain. And pt state she has cough and sore throat and that's been going on for a week. Pt states she wants to get test for STD's.

## 2019-02-05 LAB — CULTURE, GROUP A STREP (THRC)

## 2019-02-05 LAB — CERVICOVAGINAL ANCILLARY ONLY
Bacterial vaginitis: NEGATIVE
Candida vaginitis: NEGATIVE
Chlamydia: NEGATIVE
Neisseria Gonorrhea: NEGATIVE
Trichomonas: NEGATIVE

## 2019-02-05 LAB — NOVEL CORONAVIRUS, NAA (HOSP ORDER, SEND-OUT TO REF LAB; TAT 18-24 HRS): SARS-CoV-2, NAA: NOT DETECTED

## 2019-02-23 ENCOUNTER — Ambulatory Visit (HOSPITAL_COMMUNITY)
Admission: EM | Admit: 2019-02-23 | Discharge: 2019-02-23 | Disposition: A | Discharge status: Home / Self Care | Payer: 59 | Attending: Family Medicine | Admitting: Family Medicine

## 2019-02-23 ENCOUNTER — Encounter (HOSPITAL_COMMUNITY): Payer: Self-pay | Admitting: Emergency Medicine

## 2019-02-23 ENCOUNTER — Other Ambulatory Visit: Payer: Self-pay

## 2019-02-23 DIAGNOSIS — Z202 Contact with and (suspected) exposure to infections with a predominantly sexual mode of transmission: Secondary | ICD-10-CM

## 2019-02-23 NOTE — ED Provider Notes (Signed)
[place]   [id number] 02/23/19 Arrival Time: [place]:  1. Possible exposure to STD       Discharge Instructions     We have sent testing for sexually transmitted infections. We will notify you of any positive results once they are received. If required, we will prescribe any medications you might need.  Please refrain from all sexual activity for at least the next seven days.     Pending: Labs Reviewed  CERVICOVAGINAL ANCILLARY ONLY  CYTOLOGY, (ORAL, ANAL, URETHRAL) ANCILLARY ONLY    Will notify of any positive results. Instructed to refrain from sexual activity for at least seven days. No HIV/RPR testing desired. Declines empiric gonorrhea/chlamydia treatment. No s/s of PID.  Reviewed expectations re: course of current medical issues. Questions answered. Outlined signs and symptoms indicating need for more acute intervention. Patient verbalized understanding. After Visit Summary given.   SUBJECTIVE:  Elizabeth Hanson is a 24 y.o. female who reports possible exposure to STD; chlamydia. No vaginal discharge. One female sexual partner; penile-vaginal sex and oral sex. Mild sore throat for a few days. No specific aggravating or alleviating factors reported. Denies: urinary frequency, dysuria and gross hematuria. Afebrile. No abdominal or pelvic pain. Normal PO intake wihout n/v. No genital rashes or lesions. Desires vaginal and oral testing.   OBJECTIVE:  Vitals:   02/23/19 1916  BP: 119/78  Pulse: 88  Resp: 18  Temp: 98.5 F (36.9 C)  TempSrc: Oral  SpO2: 99%     General appearance: alert, cooperative, appears stated age and no distress Throat: lips, mucosa, and tongue normal; teeth and gums normal CV: RRR Lungs: CTAB Back: no CVA tenderness; FROM at waist Abdomen: soft, non-tender GU: deferred Skin: warm and dry Psychological: alert and cooperative; normal mood and affect.   Labs Reviewed  CERVICOVAGINAL ANCILLARY ONLY    CYTOLOGY, (ORAL, ANAL, URETHRAL) ANCILLARY ONLY    No Known Allergies  Past Medical History:  Diagnosis Date  . Acanthosis nigricans    Family History  Problem Relation Age of Onset  . Breast cancer Mother   . Pancreatic cancer Paternal Grandmother    Social History   Socioeconomic History  . Marital status: Divorced    Spouse name: Not on file  . Number of children: Not on file  . Years of education: Not on file  . Highest education level: Not on file  Occupational History  . Not on file  Tobacco Use  . Smoking status: Never Smoker  . Smokeless tobacco: Never Used  Substance and Sexual Activity  . Alcohol use: No  . Drug use: Yes    Types: Marijuana    Comment: 2 or 3 times a week  . Sexual activity: Yes    Birth control/protection: Condom  Other Topics Concern  . Not on file  Social History Narrative  . Not on file   Social Determinants of Health   Financial Resource Strain:   . Difficulty of Paying Living Expenses: Not on file  Food Insecurity:   . Worried About Charity fundraiser in the Last Year: Not on file  . Ran Out of Food in the Last Year: Not on file  Transportation Needs:   . Lack of Transportation (Medical): Not on file  . Lack of Transportation (Non-Medical): Not on file  Physical Activity:   . Days of Exercise per Week: Not on file  . Minutes of Exercise per Session: Not on file  Stress:   . Feeling of  Stress : Not on file  Social Connections:   . Frequency of Communication with Friends and Family: Not on file  . Frequency of Social Gatherings with Friends and Family: Not on file  . Attends Religious Services: Not on file  . Active Member of Clubs or Organizations: Not on file  . Attends Banker Meetings: Not on file  . Marital Status: Not on file  Intimate Partner Violence:   . Fear of Current or Ex-Partner: Not on file  . Emotionally Abused: Not on file  . Physically Abused: Not on file  . Sexually Abused: Not on file           Mardella Layman, MD 02/23/19 Barry [facility]ner

## 2019-02-23 NOTE — Discharge Instructions (Signed)
We have sent testing for sexually transmitted infections. We will notify you of any positive results once they are received. If required, we will prescribe any medications you might need.  Please refrain from all sexual activity for at least the next seven days.  

## 2019-02-23 NOTE — ED Triage Notes (Signed)
Pt here stating she wants STD testing; pt was tested earlier this month; pt denies sx but sts possible exposure

## 2019-02-24 LAB — CYTOLOGY, (ORAL, ANAL, URETHRAL) ANCILLARY ONLY
Chlamydia: NEGATIVE
Neisseria Gonorrhea: NEGATIVE

## 2019-02-24 LAB — CERVICOVAGINAL ANCILLARY ONLY
Chlamydia: NEGATIVE
Neisseria Gonorrhea: NEGATIVE
Trichomonas: NEGATIVE

## 2019-03-06 ENCOUNTER — Ambulatory Visit (HOSPITAL_COMMUNITY)
Admission: EM | Admit: 2019-03-06 | Discharge: 2019-03-06 | Disposition: A | Discharge status: Home / Self Care | Payer: 59 | Attending: Family Medicine | Admitting: Family Medicine

## 2019-03-06 ENCOUNTER — Other Ambulatory Visit: Payer: Self-pay

## 2019-03-06 ENCOUNTER — Encounter (HOSPITAL_COMMUNITY): Payer: Self-pay

## 2019-03-06 DIAGNOSIS — N3001 Acute cystitis with hematuria: Secondary | ICD-10-CM | POA: Insufficient documentation

## 2019-03-06 DIAGNOSIS — Z3202 Encounter for pregnancy test, result negative: Secondary | ICD-10-CM

## 2019-03-06 DIAGNOSIS — Z113 Encounter for screening for infections with a predominantly sexual mode of transmission: Secondary | ICD-10-CM | POA: Diagnosis present

## 2019-03-06 LAB — POCT URINALYSIS DIP (DEVICE)
Bilirubin Urine: NEGATIVE
Glucose, UA: NEGATIVE mg/dL
Ketones, ur: NEGATIVE mg/dL
Nitrite: NEGATIVE
Protein, ur: 100 mg/dL — AB
Specific Gravity, Urine: 1.025 (ref 1.005–1.030)
Urobilinogen, UA: 0.2 mg/dL (ref 0.0–1.0)
pH: 7 (ref 5.0–8.0)

## 2019-03-06 LAB — POCT PREGNANCY, URINE: Preg Test, Ur: NEGATIVE

## 2019-03-06 LAB — POC URINE PREG, ED: Preg Test, Ur: NEGATIVE

## 2019-03-06 MED ORDER — NITROFURANTOIN MONOHYD MACRO 100 MG PO CAPS: 100.0000 mg | ORAL_CAPSULE | Freq: Two times a day (BID) | ORAL | 0 refills | Status: AC

## 2019-03-06 MED ORDER — NITROFURANTOIN MONOHYD MACRO 100 MG PO CAPS: 100.0000 mg | ORAL_CAPSULE | Freq: Two times a day (BID) | ORAL | 0 refills | Status: DC

## 2019-03-06 NOTE — ED Provider Notes (Signed)
[facility]    CSN: [id number] Arrival date & time: 03/06/19  1135      History   Chief Complaint Chief Complaint  Patient presents with  . Urinary Frequency  . Exposure to STD    HPI Elizabeth Hanson is a 24 y.o. female.   HPI  Sexually Transmitted Disease Check and Dysuria Patient presents for sexually transmitted disease check. Sexual history reviewed with the patient. STD exposure: denies knowledge of risky exposure, although is sexually active with partner. Endorses urine frequency and lower abdominal tenderness x 2 days. Symptoms stared following anal to vaginal intercourse. Denies fever, chills, recent N&V. Endorses N&V last week following a evening of heavy alcohol consumption. Currently having period.    Past Medical History:  Diagnosis Date  . Acanthosis nigricans     Patient Active Problem List   Diagnosis Date Noted  . Vaginal pain 12/13/2015  . MDD (major depressive disorder), single episode, moderate (HCC) 09/25/2012  . Acanthosis nigricans 06/13/2012    History reviewed. No pertinent surgical history.  OB History   No obstetric history on file.      Home Medications    Prior to Admission medications   Medication Sig Start Date End Date Taking? Authorizing Provider  fluticasone (FLONASE) 50 MCG/ACT nasal spray Place 2 sprays into both nostrils daily. 01/30/16   Magdalene River, PA-C    Family History Family History  Problem Relation Age of Onset  . Breast cancer Mother   . Pancreatic cancer Paternal Grandmother     Social History Social History   Tobacco Use  . Smoking status: Never Smoker  . Smokeless tobacco: Never Used  Substance Use Topics  . Alcohol use: No  . Drug use: Yes    Types: Marijuana    Comment: 2 or 3 times a week     Allergies   Patient has no known allergies.   Review of Systems Review of Systems Pertinent negatives listed in HPI  Physical Exam Triage Vital Signs ED Triage Vitals  Enc Vitals  Group     BP 03/06/19 1147 122/74     Pulse Rate 03/06/19 1147 79     Resp 03/06/19 1147 16     Temp 03/06/19 1147 98 F (36.7 C)     Temp Source 03/06/19 1147 Oral     SpO2 03/06/19 1147 99 %     Weight --      Height --      Head Circumference --      Peak Flow --      Pain Score 03/06/19 1148 0     Pain Loc --      Pain Edu? --      Excl. in GC? --    No data found.  Updated Vital Signs BP 122/74 (BP Location: Right Arm)   Pulse 79   Temp 98 F (36.7 C) (Oral)   Resp 16   SpO2 99%   Visual Acuity Right Eye Distance:   Left Eye Distance:   Bilateral Distance:    Right Eye Near:   Left Eye Near:    Bilateral Near:     Physical Exam General appearance: alert, well developed, well nourished, cooperative and in no distress Head: Normocephalic, without obvious abnormality, atraumatic Respiratory: Respirations even and unlabored, normal respiratory rate Heart: rate and rhythm normal.  Abdomen: BS +, no distention. -CVA tenderness  Extremities: No gross deformities Skin: Skin color, texture, turgor normal. No rashes seen  Psych: Appropriate mood  and affect. Neurologic: Mental status: Alert, oriented to person, place, and time, thought content appropriate.  UC Treatments / Results  Labs (all labs ordered are listed, but only abnormal results are displayed) Labs Reviewed  POCT URINALYSIS DIP (DEVICE) - Abnormal; Notable for the following components:      Result Value   Hgb urine dipstick MODERATE (*)    Protein, ur 100 (*)    Leukocytes,Ua SMALL (*)    All other components within normal limits  URINE CULTURE  POC URINE PREG, ED  CERVICOVAGINAL ANCILLARY ONLY    EKG   Radiology No results found.  Procedures Procedures (including critical care time)  Medications Ordered in UC Medications - No data to display  Initial Impression / Assessment and Plan / UC Course  I have reviewed the triage vital signs and the nursing notes.  Pertinent labs & imaging  results that were available during my care of the patient were reviewed by me and considered in my medical decision making (see chart for details).  1. Screening examination for STD (sexually transmitted disease), vaginal cytology pending. No known exposure. Encouraged safe sexual practices by use of barrier protection. No STD treatment administered or ordered today.  2. Acute cystitis with hematuria, urine culture pending. Macrobid 100 mg BID x 7 day prescribed. Encouraged good hygiene practices with sexual intercourse to prevent recurrent infections.   Final Clinical Impressions(s) / UC Diagnoses   Final diagnoses:  Screening examination for STD (sexually transmitted disease)  Acute cystitis with hematuria   Discharge Instructions   None    ED Prescriptions    Medication Sig Dispense Auth. Provider   nitrofurantoin, macrocrystal-monohydrate, (MACROBID) 100 MG capsule  (Status: Discontinued) Take 1 capsule (100 mg total) by mouth 2 (two) times daily for 7 days. 20 capsule Scot Jun, FNP   nitrofurantoin, macrocrystal-monohydrate, (MACROBID) 100 MG capsule Take 1 capsule (100 mg total) by mouth 2 (two) times daily for 7 days. 14 capsule Scot Jun, FNP     PDMP not reviewed this encounter.   Scot Jun, FNP 03/06/19 1430

## 2019-03-06 NOTE — ED Triage Notes (Signed)
Pt present urinary frequency with some vaginal irritation. Symptoms started 3 days ago. Pt states that she will go to the bathroom to urinate then afterwards she feeling like she would have to go again. Pt would also like to be checked for STD, she denies any symptoms.

## 2019-03-08 LAB — URINE CULTURE
Culture: 80000 — AB
Special Requests: NORMAL

## 2019-03-10 LAB — CERVICOVAGINAL ANCILLARY ONLY
Bacterial vaginitis: NEGATIVE
Candida vaginitis: NEGATIVE
Chlamydia: NEGATIVE
Neisseria Gonorrhea: NEGATIVE
Trichomonas: NEGATIVE

## 2020-10-15 ENCOUNTER — Encounter (HOSPITAL_COMMUNITY): Payer: Self-pay | Admitting: *Deleted

## 2020-10-15 ENCOUNTER — Ambulatory Visit (HOSPITAL_COMMUNITY)
Admission: EM | Admit: 2020-10-15 | Discharge: 2020-10-15 | Disposition: A | Discharge status: Home / Self Care | Payer: 59 | Attending: Physician Assistant | Admitting: Physician Assistant

## 2020-10-15 ENCOUNTER — Other Ambulatory Visit: Payer: Self-pay

## 2020-10-15 DIAGNOSIS — N3001 Acute cystitis with hematuria: Secondary | ICD-10-CM

## 2020-10-15 DIAGNOSIS — N898 Other specified noninflammatory disorders of vagina: Secondary | ICD-10-CM | POA: Diagnosis present

## 2020-10-15 LAB — POCT URINALYSIS DIPSTICK, ED / UC
Bilirubin Urine: NEGATIVE
Glucose, UA: NEGATIVE mg/dL
Ketones, ur: NEGATIVE mg/dL
Nitrite: POSITIVE — AB
Protein, ur: 30 mg/dL — AB
Specific Gravity, Urine: 1.02 (ref 1.005–1.030)
Urobilinogen, UA: 0.2 mg/dL (ref 0.0–1.0)
pH: 6.5 (ref 5.0–8.0)

## 2020-10-15 LAB — POC URINE PREG, ED: Preg Test, Ur: NEGATIVE

## 2020-10-15 MED ORDER — NITROFURANTOIN MONOHYD MACRO 100 MG PO CAPS: 100.0000 mg | ORAL_CAPSULE | Freq: Two times a day (BID) | ORAL | 0 refills | Status: DC

## 2020-10-15 NOTE — Discharge Instructions (Addendum)
Take antibiotic as prescribed. Follow up if symptoms fail to improve or worsen.

## 2020-10-15 NOTE — ED Provider Notes (Signed)
[facility]    CSN: [id number] Arrival date & time: 10/15/20  1631      History   Chief Complaint Chief Complaint  Patient presents with   Vaginal Discharge   Polyuria    HPI Shamaine Mulkern is a 25 y.o. female.   Patient here today for evaluation of urinary urgency and frequency.  She reports symptoms started 2 days ago.  She has noticed some tan vaginal discharge as well.  She denies any fever or chills.  She has not had any back pain.  She denies any abdominal pain, nausea or vomiting.  She does not report any treatment for symptoms.   Vaginal Discharge Associated symptoms: no abdominal pain, no dysuria, no fever, no nausea and no vomiting    Past Medical History:  Diagnosis Date   Acanthosis nigricans     Patient Active Problem List   Diagnosis Date Noted   Vaginal pain 12/13/2015   MDD (major depressive disorder), single episode, moderate (HCC) 09/25/2012   Acanthosis nigricans 06/13/2012    History reviewed. No pertinent surgical history.  OB History   No obstetric history on file.      Home Medications    Prior to Admission medications   Medication Sig Start Date End Date Taking? Authorizing Provider  nitrofurantoin, macrocrystal-monohydrate, (MACROBID) 100 MG capsule Take 1 capsule (100 mg total) by mouth 2 (two) times daily. 10/15/20  Yes Tomi Bamberger, PA-C  fluticasone (FLONASE) 50 MCG/ACT nasal spray Place 2 sprays into both nostrils daily. 01/30/16   Magdalene River, PA-C    Family History Family History  Problem Relation Age of Onset   Breast cancer Mother    Pancreatic cancer Paternal Grandmother     Social History Social History   Tobacco Use   Smoking status: Every Day   Smokeless tobacco: Never   Tobacco comments:    Black & milds  Vaping Use   Vaping Use: Never used  Substance Use Topics   Alcohol use: Yes    Comment: occasionally   Drug use: Not Currently    Types: Marijuana     Allergies   Patient has no  known allergies.   Review of Systems Review of Systems  Constitutional:  Negative for chills and fever.  Respiratory:  Negative for shortness of breath.   Gastrointestinal:  Negative for abdominal pain, nausea and vomiting.  Genitourinary:  Positive for frequency, urgency and vaginal discharge. Negative for dysuria.  Musculoskeletal:  Negative for back pain.    Physical Exam Triage Vital Signs ED Triage Vitals  Enc Vitals Group     BP 10/15/20 1650 113/74     Pulse Rate 10/15/20 1650 78     Resp 10/15/20 1650 16     Temp 10/15/20 1650 98.3 F (36.8 C)     Temp Source 10/15/20 1650 Oral     SpO2 10/15/20 1650 97 %     Weight --      Height --      Head Circumference --      Peak Flow --      Pain Score 10/15/20 1652 0     Pain Loc --      Pain Edu? --      Excl. in GC? --    No data found.  Updated Vital Signs BP 113/74   Pulse 78   Temp 98.3 F (36.8 C) (Oral)   Resp 16   LMP  (LMP Unknown) Comment: sometime in August 2022  SpO2 97%      Physical Exam Vitals and nursing note reviewed.  Constitutional:      General: She is not in acute distress.    Appearance: Normal appearance. She is not ill-appearing.  HENT:     Head: Normocephalic and atraumatic.     Nose: Nose normal.  Cardiovascular:     Rate and Rhythm: Normal rate and regular rhythm.     Heart sounds: Normal heart sounds. No murmur heard. Pulmonary:     Effort: Pulmonary effort is normal. No respiratory distress.     Breath sounds: Normal breath sounds. No wheezing, rhonchi or rales.  Abdominal:     General: Abdomen is flat. Bowel sounds are normal. There is no distension.     Palpations: Abdomen is soft.     Tenderness: There is no abdominal tenderness. There is no right CVA tenderness, left CVA tenderness or guarding.  Skin:    General: Skin is warm and dry.  Neurological:     Mental Status: She is alert.  Psychiatric:        Mood and Affect: Mood normal.        Thought Content: Thought  content normal.     UC Treatments / Results  Labs (all labs ordered are listed, but only abnormal results are displayed) Labs Reviewed  POCT URINALYSIS DIPSTICK, ED / UC - Abnormal; Notable for the following components:      Result Value   Hgb urine dipstick SMALL (*)    Protein, ur 30 (*)    Nitrite POSITIVE (*)    Leukocytes,Ua MODERATE (*)    All other components within normal limits  URINE CULTURE  POC URINE PREG, ED  CERVICOVAGINAL ANCILLARY ONLY    EKG   Radiology No results found.  Procedures Procedures (including critical care time)  Medications Ordered in UC Medications - No data to display  Initial Impression / Assessment and Plan / UC Course  I have reviewed the triage vital signs and the nursing notes.  Pertinent labs & imaging results that were available during my care of the patient were reviewed by me and considered in my medical decision making (see chart for details).  Macrobid prescribed for UTI.  Urine culture ordered.  STD screening ordered as well with BV/yeast screening given discharge.  Recommended follow-up with any further concerns or if symptoms worsen in any way.  Final Clinical Impressions(s) / UC Diagnoses   Final diagnoses:  Vaginal discharge  Acute cystitis with hematuria     Discharge Instructions      Take antibiotic as prescribed. Follow up if symptoms fail to improve or worsen.      ED Prescriptions     Medication Sig Dispense Auth. Provider   nitrofurantoin, macrocrystal-monohydrate, (MACROBID) 100 MG capsule Take 1 capsule (100 mg total) by mouth 2 (two) times daily. 10 capsule Tomi Bamberger, PA-C      PDMP not reviewed this encounter.   Tomi Bamberger, PA-C 10/15/20 1717

## 2020-10-15 NOTE — ED Triage Notes (Signed)
C/O scant amount of tan discharge when wiping 2 nights ago.  Also started with polyuria and urinary urgency 2 days ago.  Denies fevers. Denies n/v.

## 2020-10-16 ENCOUNTER — Telehealth (HOSPITAL_COMMUNITY): Payer: Self-pay | Admitting: Emergency Medicine

## 2020-10-16 LAB — CERVICOVAGINAL ANCILLARY ONLY
Bacterial Vaginitis (gardnerella): POSITIVE — AB
Candida Glabrata: NEGATIVE
Candida Vaginitis: POSITIVE — AB
Chlamydia: NEGATIVE
Comment: NEGATIVE
Comment: NEGATIVE
Comment: NEGATIVE
Comment: NEGATIVE
Comment: NEGATIVE
Comment: NORMAL
Neisseria Gonorrhea: NEGATIVE
Trichomonas: NEGATIVE

## 2020-10-16 MED ORDER — METRONIDAZOLE 500 MG PO TABS: 500.0000 mg | ORAL_TABLET | Freq: Two times a day (BID) | ORAL | 0 refills | Status: DC

## 2020-10-16 MED ORDER — FLUCONAZOLE 150 MG PO TABS: 150.0000 mg | ORAL_TABLET | Freq: Once | ORAL | 0 refills | Status: AC

## 2020-10-17 LAB — URINE CULTURE: Culture: >=100000 — AB

## 2021-01-30 ENCOUNTER — Other Ambulatory Visit: Payer: Self-pay

## 2021-01-30 ENCOUNTER — Encounter (HOSPITAL_COMMUNITY): Payer: Self-pay | Admitting: Emergency Medicine

## 2021-01-30 ENCOUNTER — Ambulatory Visit (HOSPITAL_COMMUNITY)
Admission: EM | Admit: 2021-01-30 | Discharge: 2021-01-30 | Disposition: A | Discharge status: Home / Self Care | Payer: PRIVATE HEALTH INSURANCE | Attending: Emergency Medicine | Admitting: Emergency Medicine

## 2021-01-30 DIAGNOSIS — Z113 Encounter for screening for infections with a predominantly sexual mode of transmission: Secondary | ICD-10-CM | POA: Insufficient documentation

## 2021-01-30 DIAGNOSIS — R102 Pelvic and perineal pain: Secondary | ICD-10-CM | POA: Insufficient documentation

## 2021-01-30 LAB — POCT URINALYSIS DIPSTICK, ED / UC
Bilirubin Urine: NEGATIVE
Glucose, UA: NEGATIVE mg/dL
Hgb urine dipstick: NEGATIVE
Ketones, ur: NEGATIVE mg/dL
Leukocytes,Ua: NEGATIVE
Nitrite: NEGATIVE
Protein, ur: NEGATIVE mg/dL
Specific Gravity, Urine: 1.02 (ref 1.005–1.030)
Urobilinogen, UA: 0.2 mg/dL (ref 0.0–1.0)
pH: 7 (ref 5.0–8.0)

## 2021-01-30 LAB — POC URINE PREG, ED: Preg Test, Ur: NEGATIVE

## 2021-01-30 LAB — HIV ANTIBODY (ROUTINE TESTING W REFLEX): HIV Screen 4th Generation wRfx: NONREACTIVE

## 2021-01-30 MED ORDER — IBUPROFEN 600 MG PO TABS: 600.0000 mg | ORAL_TABLET | Freq: Four times a day (QID) | ORAL | 0 refills | Status: AC | PRN

## 2021-01-30 NOTE — Discharge Instructions (Addendum)
Urinalysis was negative for infection and your urine pregnancy is negative.  May take 600 mg of ibuprofen, 1000 mg of Tylenol together 3-4 times a day as needed for pain.  Give [place] a working phone number so that we can contact you if needed. Refrain from sexual contact until all of your labs have come back, symptoms have resolved, and your partner(s) are treated if necessary. Return to the ER if you get worse, have a fever >100.4, or for any concerns.   Below is a list of primary care practices who are taking new patients for you to follow-up with.  [facility] -multiple locations.  See website at [email or web address]  [facility] internal medicine clinic Ground Floor - [facility], [address] [place], [place], [place] [address] [phone]  [facility] Primary Care at [facility] [address] [place], [place] [address] [phone]  [facility] [address] [place], [place] [address] [phone]  [facility] [phone] [address] [place] [address]  [facility]: [address] [place] [address]  [phone]  [facility]: [address] [place] [address]  [phone]  [facility] : [address] [place] [address] [phone]  [facility] Primary Care: [address] [place] [address] [phone]  [facility]: [address] [place] [address] [phone]  Dr. Oneal Grout [address] [facility] [place] [address]  [phone]  Go to www.goodrx.com  or www.costplusdrugs.com to look up your medications. This will give you a list of where you can find your prescriptions at the most affordable prices. Or ask the pharmacist what the cash price  is, or if they have any other discount programs available to help make your medication more affordable. This can be less expensive than what you would pay with insurance.

## 2021-01-30 NOTE — ED Provider Notes (Signed)
HPI  SUBJECTIVE:  Elizabeth Hanson is a 26 y.o. female who presents with dyspareunia followed by throbbing, sore, seconds long diffuse abdominal pain starting last night after having intercourse.  She states that there was enough lubrication.  No fevers, abdominal, back pain, urinary complaints.  No genital rash, vaginal odor, bleeding, discharge, itching.  No antipyretic in the past 6 hours.  She had sexual contact with a new female partner 2 weeks ago, who is asymptomatic, but she is concerned about STDs.  She also has a long term female partner who is asymptomatic.  She states this feels similar to when she had gonorrhea/chlamydia previously.  No aggravating or alleviating factors.  She has not tried anything for this.  She also has a history of BV and yeast.  No history of syphilis, HIV, HSV, trichomonas, diabetes, endometriosis, fibroids, abdominal surgeries, PID, ectopic pregnancy, ovarian cyst.  LMP: 12/21.  Denies possibility being pregnant.  PMD: None.   Past Medical History:  Diagnosis Date   Acanthosis nigricans     History reviewed. No pertinent surgical history.  Family History  Problem Relation Age of Onset   Breast cancer Mother    Pancreatic cancer Paternal Grandmother     Social History   Tobacco Use   Smoking status: Every Day   Smokeless tobacco: Never   Tobacco comments:    Black & milds  Vaping Use   Vaping Use: Never used  Substance Use Topics   Alcohol use: Yes    Comment: occasionally   Drug use: Not Currently    Types: Marijuana    No current facility-administered medications for this encounter.  Current Outpatient Medications:    ibuprofen (ADVIL) 600 MG tablet, Take 1 tablet (600 mg total) by mouth every 6 (six) hours as needed., Disp: 30 tablet, Rfl: 0  No Known Allergies   ROS  As noted in HPI.   Physical Exam  BP 114/69 (BP Location: Left Arm)    Pulse 74    Temp 98.5 F (36.9 C) (Oral)    Resp 16    LMP 01/17/2021 (Approximate)    SpO2 100%    Constitutional: Well developed, well nourished, no acute distress Eyes:  EOMI, conjunctiva normal bilaterally HENT: Normocephalic, atraumatic,mucus membranes moist Respiratory: Normal inspiratory effort Cardiovascular: Normal rate GI: nondistended soft, nontender.  Positive suprapubic tenderness  back: No CVA tenderness GU: External genitalia normal.  Normal vaginal mucosa.  Normal os. No discharge.  Uterus smooth, NT. No  CMT. No adnexal tenderness. No adnexal masses.  Chaperone present during exam skin: No rash, skin intact Musculoskeletal: no deformities Neurologic: Alert & oriented x 3, no focal neuro deficits Psychiatric: Speech and behavior appropriate   ED Course   Medications - No data to display  Orders Placed This Encounter  Procedures   RPR    Standing Status:   Standing    Number of Occurrences:   1   HIV Antibody (routine testing w rflx)    Standing Status:   Standing    Number of Occurrences:   1   POC urine pregnancy    Standing Status:   Standing    Number of Occurrences:   1   POC Urinalysis dipstick    Standing Status:   Standing    Number of Occurrences:   1    Results for orders placed or performed during the hospital encounter of 01/30/21 (from the past 24 hour(s))  POC Urinalysis dipstick     Status: None  Collection Time: 01/30/21 11:31 AM  Result Value Ref Range   Glucose, UA NEGATIVE NEGATIVE mg/dL   Bilirubin Urine NEGATIVE NEGATIVE   Ketones, ur NEGATIVE NEGATIVE mg/dL   Specific Gravity, Urine 1.020 1.005 - 1.030   Hgb urine dipstick NEGATIVE NEGATIVE   pH 7.0 5.0 - 8.0   Protein, ur NEGATIVE NEGATIVE mg/dL   Urobilinogen, UA 0.2 0.0 - 1.0 mg/dL   Nitrite NEGATIVE NEGATIVE   Leukocytes,Ua NEGATIVE NEGATIVE  POC urine pregnancy     Status: None   Collection Time: 01/30/21 11:34 AM  Result Value Ref Range   Preg Test, Ur NEGATIVE NEGATIVE   No results found.  ED Clinical Impression  1. Pelvic pain in female   2. Screening for  STDs (sexually transmitted diseases)     ED Assessment/Plan  Patient with low midline abdominal tenderness and dyspareunia last night.  There is no evidence of PID.  She is also requesting STD screening.  Checking UA, U preg, gonorrhea, chlamydia, HIV, syphilis, trichomonas, BV, yeast screening.  Patient would like to wait for labs prior to initiating treatment.  Urine pregnancy negative.  UA negative for UTI.  Will not treat empirically now for STDs.  Patient can return for treatment if necessary.  Advised pt to refrain from sexual contact until she knows lab results, symptoms resolve, and partner(s) are treated if necessary. Pt provided working phone number. Follow-up with PMD of choice as needed.  Will provide list and order assistance in finding a PMD.  Discussed labs, MDM, plan and followup with patient. Pt agrees with plan.   Meds ordered this encounter  Medications   ibuprofen (ADVIL) 600 MG tablet    Sig: Take 1 tablet (600 mg total) by mouth every 6 (six) hours as needed.    Dispense:  30 tablet    Refill:  0    *This clinic note was created using [employer]. Therefore, there may be occasional mistakes despite careful proofreading.  ?     Melynda Ripple, MD 01/31/21 2007

## 2021-01-30 NOTE — ED Triage Notes (Signed)
Patient c/o painful intercourse (pelvic pain) x 1 day.   Patient denies N/V/D. Patient denies dysuria and vaginal discharge.   Patient endorses 1 new sexual partner.  Patient denies any recent STD exposure but wants STD testing.

## 2021-01-31 LAB — CERVICOVAGINAL ANCILLARY ONLY
Bacterial Vaginitis (gardnerella): NEGATIVE
Candida Glabrata: NEGATIVE
Candida Vaginitis: NEGATIVE
Chlamydia: NEGATIVE
Comment: NEGATIVE
Comment: NEGATIVE
Comment: NEGATIVE
Comment: NEGATIVE
Comment: NEGATIVE
Comment: NORMAL
Neisseria Gonorrhea: NEGATIVE
Trichomonas: NEGATIVE

## 2021-01-31 LAB — RPR: RPR Ser Ql: NONREACTIVE

## 2021-05-20 ENCOUNTER — Ambulatory Visit (HOSPITAL_COMMUNITY)
Admission: EM | Admit: 2021-05-20 | Discharge: 2021-05-20 | Disposition: A | Discharge status: Home / Self Care | Payer: PRIVATE HEALTH INSURANCE

## 2021-12-07 ENCOUNTER — Emergency Department (HOSPITAL_COMMUNITY)
Admission: EM | Admit: 2021-12-07 | Discharge: 2021-12-08 | Disposition: A | Discharge status: Home / Self Care | Payer: Self-pay | Attending: Emergency Medicine | Admitting: Emergency Medicine

## 2021-12-07 ENCOUNTER — Encounter (HOSPITAL_COMMUNITY): Payer: Self-pay | Admitting: Emergency Medicine

## 2021-12-07 ENCOUNTER — Other Ambulatory Visit: Payer: Self-pay

## 2021-12-07 DIAGNOSIS — U071 COVID-19: Secondary | ICD-10-CM | POA: Insufficient documentation

## 2021-12-07 DIAGNOSIS — R109 Unspecified abdominal pain: Secondary | ICD-10-CM | POA: Insufficient documentation

## 2021-12-07 LAB — CBC WITH DIFFERENTIAL/PLATELET
Abs Immature Granulocytes: 0.03 10*3/uL (ref 0.00–0.07)
Basophils Absolute: 0 10*3/uL (ref 0.0–0.1)
Basophils Relative: 0 %
Eosinophils Absolute: 0 10*3/uL (ref 0.0–0.5)
Eosinophils Relative: 0 %
HCT: 44.5 % (ref 36.0–46.0)
Hemoglobin: 15.1 g/dL — ABNORMAL HIGH (ref 12.0–15.0)
Immature Granulocytes: 0 %
Lymphocytes Relative: 21 %
Lymphs Abs: 2.2 10*3/uL (ref 0.7–4.0)
MCH: 30 pg (ref 26.0–34.0)
MCHC: 33.9 g/dL (ref 30.0–36.0)
MCV: 88.3 fL (ref 80.0–100.0)
Monocytes Absolute: 1.3 10*3/uL — ABNORMAL HIGH (ref 0.1–1.0)
Monocytes Relative: 13 %
Neutro Abs: 6.7 10*3/uL (ref 1.7–7.7)
Neutrophils Relative %: 66 %
Platelets: 283 10*3/uL (ref 150–400)
RBC: 5.04 MIL/uL (ref 3.87–5.11)
RDW: 13.4 % (ref 11.5–15.5)
WBC: 10.3 10*3/uL (ref 4.0–10.5)
nRBC: 0 % (ref 0.0–0.2)

## 2021-12-07 LAB — PREGNANCY, URINE: Preg Test, Ur: NEGATIVE

## 2021-12-07 LAB — ACETAMINOPHEN LEVEL: Acetaminophen (Tylenol), Serum: <10 ug/mL — ABNORMAL LOW (ref 10–30)

## 2021-12-07 NOTE — ED Triage Notes (Signed)
Patient reports multiple emesis today  and runny nose with sore throat for 3 days , takes OTC medication with no relief.

## 2021-12-07 NOTE — ED Provider Triage Note (Signed)
Emergency Medicine Provider Triage Evaluation Note  Elizabeth Hanson , a 26 y.o. female  was evaluated in triage.  Pt complains of nausea and vomiting onset today, persistent. Had preceding URI symptoms and sore throat. Was around a family member who was sick recently. Has been using Dayquil and "eyeballing" the amount; is unsure if she is vomiting because she took too much of this medication. LMP 2-3 weeks ago.  Review of Systems  Positive: As above Negative: As above  Physical Exam  BP (!) 147/69   Pulse 89   Temp 98.2 F (36.8 C)   Resp 16   SpO2 99%  Gen:   Awake, no distress   Resp:  Normal effort  MSK:   Moves extremities without difficulty  Other:  Abdomen nontender, nondistended  Medical Decision Making  Medically screening exam initiated at 11:16 PM.  Appropriate orders placed.  Elizabeth Hanson was informed that the remainder of the evaluation will be completed by another provider, this initial triage assessment does not replace that evaluation, and the importance of remaining in the ED until their evaluation is complete.  N/V - plan for labs, APAP level, hCG   Antony Madura, PA-C 12/07/21 2318

## 2021-12-08 LAB — HEPATIC FUNCTION PANEL
ALT: 17 U/L (ref 0–44)
AST: 20 U/L (ref 15–41)
Albumin: 4.2 g/dL (ref 3.5–5.0)
Alkaline Phosphatase: 52 U/L (ref 38–126)
Bilirubin, Direct: <0.1 mg/dL (ref 0.0–0.2)
Total Bilirubin: 0.6 mg/dL (ref 0.3–1.2)
Total Protein: 7.6 g/dL (ref 6.5–8.1)

## 2021-12-08 LAB — SARS CORONAVIRUS 2 BY RT PCR: SARS Coronavirus 2 by RT PCR: POSITIVE — AB

## 2021-12-08 MED ORDER — ONDANSETRON 4 MG PO TBDP
4.0000 mg | ORAL_TABLET | Freq: Once | ORAL | Status: AC
  Administered 2021-12-08: 4 mg via ORAL
  Filled 2021-12-08: qty 1

## 2021-12-08 MED ORDER — ONDANSETRON 4 MG PO TBDP: 4.0000 mg | ORAL_TABLET | Freq: Three times a day (TID) | ORAL | 0 refills | Status: DC | PRN

## 2021-12-08 NOTE — ED Provider Notes (Signed)
[facility] EMERGENCY DEPARTMENT Provider Note   CSN: [id number] Arrival date & time: 12/07/21  2258     History  Chief Complaint  Patient presents with   Emesis    Elizabeth Hanson is a 26 y.o. female.  26 year old female presents today for evaluation of cough, congestion, sore throat, and emesis since around 7 AM yesterday.  She states she has been around her cousin who has had URI symptoms throughout last week.  She denies shortness of breath, chest pain.  She endorses mild abdominal discomfort from episodes of emesis.  Otherwise without abdominal discomfort.  Denies hematemesis.  States the Zofran she received in triage has significantly helped her symptoms.  She has been able to tolerate fluid intake since then.  States that sore throat has improved since late yesterday.  The history is provided by the patient. No language interpreter was used.       Home Medications Prior to Admission medications   Medication Sig Start Date End Date Taking? Authorizing Provider  ondansetron (ZOFRAN-ODT) 4 MG disintegrating tablet Take 1 tablet (4 mg total) by mouth every 8 (eight) hours as needed for nausea or vomiting. 12/08/21  Yes Kahleb Mcclane, PA-C  ibuprofen (ADVIL) 600 MG tablet Take 1 tablet (600 mg total) by mouth every 6 (six) hours as needed. 01/30/21   Domenick Gong, MD      Allergies    Patient has no known allergies.    Review of Systems   Review of Systems  Constitutional:  Negative for fever.  HENT:  Positive for congestion, postnasal drip and sore throat.   Respiratory:  Positive for cough. Negative for shortness of breath.   Cardiovascular:  Negative for chest pain.  Gastrointestinal:  Positive for abdominal pain, nausea and vomiting.  Neurological:  Negative for light-headedness.  All other systems reviewed and are negative.   Physical Exam Updated Vital Signs BP 115/65 (BP Location: Right Arm)   Pulse 68   Temp 98.5 F (36.9 C)   Resp 17   LMP  11/15/2021   SpO2 98%  Physical Exam Vitals and nursing note reviewed.  Constitutional:      General: She is not in acute distress.    Appearance: Normal appearance. She is not ill-appearing.  HENT:     Head: Normocephalic and atraumatic.     Right Ear: Tympanic membrane, ear canal and external ear normal. There is no impacted cerumen.     Left Ear: Tympanic membrane, ear canal and external ear normal. There is no impacted cerumen.     Nose: Nose normal.     Mouth/Throat:     Mouth: Mucous membranes are moist.     Pharynx: No oropharyngeal exudate or posterior oropharyngeal erythema.  Eyes:     General: No scleral icterus.    Extraocular Movements: Extraocular movements intact.     Conjunctiva/sclera: Conjunctivae normal.  Cardiovascular:     Rate and Rhythm: Normal rate and regular rhythm.     Pulses: Normal pulses.  Pulmonary:     Effort: Pulmonary effort is normal. No respiratory distress.     Breath sounds: Normal breath sounds. No wheezing or rales.  Abdominal:     General: There is no distension.     Palpations: Abdomen is soft.     Tenderness: There is no abdominal tenderness. There is no right CVA tenderness, left CVA tenderness or guarding.  Musculoskeletal:        General: Normal range of motion.  Cervical back: Normal range of motion.  Skin:    General: Skin is warm and dry.  Neurological:     General: No focal deficit present.     Mental Status: She is alert. Mental status is at baseline.     ED Results / Procedures / Treatments   Labs (all labs ordered are listed, but only abnormal results are displayed) Labs Reviewed  SARS CORONAVIRUS 2 BY RT PCR - Abnormal; Notable for the following components:      Result Value   SARS Coronavirus 2 by RT PCR POSITIVE (*)    All other components within normal limits  ACETAMINOPHEN LEVEL - Abnormal; Notable for the following components:   Acetaminophen (Tylenol), Serum <10 (*)    All other components within normal  limits  CBC WITH DIFFERENTIAL/PLATELET - Abnormal; Notable for the following components:   Hemoglobin 15.1 (*)    Monocytes Absolute 1.3 (*)    All other components within normal limits  HEPATIC FUNCTION PANEL  PREGNANCY, URINE    EKG None  Radiology No results found.  Procedures Procedures    Medications Ordered in ED Medications  ondansetron (ZOFRAN-ODT) disintegrating tablet 4 mg (4 mg Oral Given 12/08/21 0231)    ED Course/ Medical Decision Making/ A&P Clinical Course as of 12/08/21 0912  Sat Dec 08, 2021  0843 SARS Coronavirus 2 by RT PCR(!): POSITIVE [JL]    Clinical Course User Index [JL] Ernie Avena, MD                           Medical Decision Making Amount and/or Complexity of Data Reviewed Labs:  Decision-making details documented in ED Course.  Risk Prescription drug management.   Medical Decision Making / ED Course   This patient presents to the ED for concern of URI symptoms, emesis, this involves an extensive number of treatment options, and is a complaint that carries with it a high risk of complications and morbidity.  The differential diagnosis includes viral URI, appendicitis, gastroenteritis  MDM: 26 year old female presents with above-mentioned symptoms.  She is otherwise healthy.  Work-up shows she is positive for COVID-19.  Significant improvement in her nausea after dose of Zofran.  Has been able to tolerate p.o. intake since then.  She is afebrile, abdomen soft nontender.  Low suspicion for appendicitis, or other primary intra-abdominal process as the source of her mild abdominal discomfort.  Likely related to her episodes of emesis.  Without tachycardia.  Low suspicion for dehydration.  Patient is appropriate for discharge.  Will provide Zofran.  Discussed sinus rinse given her nasal congestion and postnasal drainage.  Given referral to [facility] health today health and wellness clinic if she does not have a PCP.  Return precautions discussed.   Patient voices understanding and is in agreement with plan.   Lab Tests: -I ordered, reviewed, and interpreted labs.   The pertinent results include:   Labs Reviewed  SARS CORONAVIRUS 2 BY RT PCR - Abnormal; Notable for the following components:      Result Value   SARS Coronavirus 2 by RT PCR POSITIVE (*)    All other components within normal limits  ACETAMINOPHEN LEVEL - Abnormal; Notable for the following components:   Acetaminophen (Tylenol), Serum <10 (*)    All other components within normal limits  CBC WITH DIFFERENTIAL/PLATELET - Abnormal; Notable for the following components:   Hemoglobin 15.1 (*)    Monocytes Absolute 1.3 (*)    All  other components within normal limits  HEPATIC FUNCTION PANEL  PREGNANCY, URINE      EKG  EKG Interpretation  Date/Time:    Ventricular Rate:    PR Interval:    QRS Duration:   QT Interval:    QTC Calculation:   R Axis:     Text Interpretation:           Medicines ordered and prescription drug management: Meds ordered this encounter  Medications   ondansetron (ZOFRAN-ODT) disintegrating tablet 4 mg   ondansetron (ZOFRAN-ODT) 4 MG disintegrating tablet    Sig: Take 1 tablet (4 mg total) by mouth every 8 (eight) hours as needed for nausea or vomiting.    Dispense:  20 tablet    Refill:  0    Order Specific Question:   Supervising Provider    Answer:   MILLER, BRIAN [[id number]]    -I have reviewed the patients home medicines and have made adjustments as needed  Reevaluation: After the interventions noted above, I reevaluated the patient and found that they have :improved  Co morbidities that complicate the patient evaluation  Past Medical History:  Diagnosis Date   Acanthosis nigricans       Dispostion: Patient is appropriate for discharge.  Discharged in stable condition.  Return precaution discussed.  Patient voices understanding and is in agreement with plan.  Final Clinical Impression(s) / ED Diagnoses Final  diagnoses:  COVID-19    Rx / DC Orders ED Discharge Orders          Ordered    ondansetron (ZOFRAN-ODT) 4 MG disintegrating tablet  Every 8 hours PRN        12/08/21 0912              Marita [place], PA-C 12/08/21 0919    Ernie Avena, MD 12/08/21 1310

## 2021-12-08 NOTE — Discharge Instructions (Signed)
Your work-up today showed that you have COVID.  He did receive a dose of Zofran in the emergency room with improvement in your symptoms.  Your work-up otherwise was reassuring.  Your exam otherwise was reassuring.  Continue to drink plenty of fluids.  I recommend you do a sinus rinse for the sinus congestion.  For any concerning symptoms please return to the emergency room otherwise follow-up with your primary care provider.  If you do not have a primary care provider I have given you information for [facility], health and wellness clinic to establish care.

## 2022-11-04 ENCOUNTER — Other Ambulatory Visit: Payer: Self-pay

## 2022-11-04 DIAGNOSIS — N6320 Unspecified lump in the left breast, unspecified quadrant: Secondary | ICD-10-CM

## 2022-11-07 ENCOUNTER — Ambulatory Visit: Payer: Self-pay | Admitting: Hematology and Oncology

## 2022-11-07 VITALS — BP 130/76 | Wt 149.0 lb

## 2022-11-07 DIAGNOSIS — N6320 Unspecified lump in the left breast, unspecified quadrant: Secondary | ICD-10-CM

## 2022-11-07 NOTE — Patient Instructions (Signed)
Taught Vela Render about self breast awareness and gave educational materials to take home. Patient did not need a Pap smear today due to last Pap smear was in 11/05/2022 per patient. Let her know [facility] will cover Pap smears every 5 years unless has a history of abnormal Pap smears. Referred patient to the [facility] for diagnostic mammogram. Appointment scheduled for 11/12/22. Patient aware of appointment and will be there. Let patient know will follow up with her within the next couple weeks with results. Elizabeth Hanson verbalized understanding.  Pascal Lux, NP 11:50 AM

## 2022-11-07 NOTE — Progress Notes (Addendum)
[name] is a 27 y.o. female who presents to [facility] clinic today with complaint of left breast mass.    Pap Smear: Pap not smear completed today. Last Pap smear was 10/22/2018  and was normal. Per patient has no history of an abnormal Pap smear. Last Pap smear result is available in Epic.   Physical exam: Breasts Breasts symmetrical. No skin abnormalities bilateral breasts. No nipple retraction bilateral breasts. No nipple discharge bilateral breasts. No lymphadenopathy. No lumps palpated right breasts. Pea sized nodule noted to the left breast, inner lower quadrant.     Pelvic/Bimanual Pap is not indicated today    Smoking History: Patient has never smoked and was not referred to quit line.    Patient Navigation: Patient education provided. Access to services provided for patient through [facility] program. No interpreter provided. No transportation provided   Colorectal Cancer Screening: Per patient has never had colonoscopy completed No complaints today.    Breast and Cervical Cancer Risk Assessment: Patient has  family history of breast cancer, with her mother diagnosed at 30. Patient does not have history of cervical dysplasia, immunocompromised, or DES exposure in-utero.  Risk Assessment   No risk assessment data     A: [facility] exam without pap smear Complaint of left breast as noted on exam.   P: Referred patient to the [facility] for a diagnostic mammogram. Appointment scheduled 11/12/2022.  Ilda Basset A, NP 11/07/2022 11:25 AM

## 2022-11-12 ENCOUNTER — Ambulatory Visit
Admission: RE | Admit: 2022-11-12 | Discharge: 2022-11-12 | Disposition: A | Discharge status: Home / Self Care | Payer: No Typology Code available for payment source | Source: Ambulatory Visit | Attending: Obstetrics and Gynecology | Admitting: Obstetrics and Gynecology

## 2022-11-12 ENCOUNTER — Other Ambulatory Visit: Payer: Self-pay | Admitting: Obstetrics and Gynecology

## 2022-11-12 DIAGNOSIS — N6320 Unspecified lump in the left breast, unspecified quadrant: Secondary | ICD-10-CM

## 2022-12-11 ENCOUNTER — Other Ambulatory Visit: Payer: Self-pay | Admitting: Hematology and Oncology

## 2022-12-11 DIAGNOSIS — Z803 Family history of malignant neoplasm of breast: Secondary | ICD-10-CM

## 2023-02-18 ENCOUNTER — Inpatient Hospital Stay: Payer: Self-pay | Attending: Genetic Counselor | Admitting: Genetic Counselor

## 2023-02-18 ENCOUNTER — Inpatient Hospital Stay: Payer: Self-pay

## 2023-05-14 ENCOUNTER — Other Ambulatory Visit: Payer: No Typology Code available for payment source

## 2023-12-17 ENCOUNTER — Telehealth: Payer: Self-pay

## 2023-12-17 NOTE — Telephone Encounter (Signed)
 Patient called and stated to Johney Moccasin, she is due for a 66-month follow-up ultrasound, last ultrasound was 07/2023, not sure which doctor/provider referred her for ultrasound. I informed patient the last ultrasound report we have in our records, are dated 11/12/2022, recommend mammograms to start at age 28 years as her mother was diagnosed with breast cancer at age 56 years. Patient stated she is sure she had an ultrasound in 07/2023, recommending a 25-month follow-up ultrasound. I have checked with [facility]-verified per [facility] ([facility] Medical Records) last ultrasound was 11/12/2022, and also verified with [facility], no ultrasound or any imaging on file. Left message on voicemail requesting patient to return call.

## 2023-12-18 ENCOUNTER — Telehealth: Payer: Self-pay

## 2023-12-18 ENCOUNTER — Other Ambulatory Visit: Payer: Self-pay

## 2023-12-18 DIAGNOSIS — Z803 Family history of malignant neoplasm of breast: Secondary | ICD-10-CM

## 2023-12-18 NOTE — Telephone Encounter (Signed)
 Patient returned call, was informed, we have checked with both [facility] and [facility], and there is no record of her having an ultrasound in 07/2023. Patient informed was referred to genetic in 01/2023, but did not keep appointment as scheduled for 02/18/2023. Discussed 10/2022 ultrasound results, informed patient to call back for appointment if feels a new breast lump, notices any changes in breasts (skin, shape, color), pain in 1 spot, pain that is not consistent with time of menstrual cycle, breast discharge. Patient informed [facility] radiologist recommended her to start mammograms at age 57 years as her mother was diagnosed at 49 years of age, and can do genetic testing as recommended per [facility] provider. Patient verbalized understanding, and requested to do genetic testing. Referral to genetic counselor sent.

## 2024-01-05 ENCOUNTER — Encounter: Payer: Self-pay | Admitting: Genetic Counselor

## 2024-01-05 ENCOUNTER — Inpatient Hospital Stay: Payer: Self-pay

## 2024-01-05 ENCOUNTER — Inpatient Hospital Stay: Payer: Self-pay | Attending: Internal Medicine | Admitting: Genetic Counselor

## 2024-01-05 DIAGNOSIS — Z800 Family history of malignant neoplasm of digestive organs: Secondary | ICD-10-CM

## 2024-01-05 DIAGNOSIS — Z803 Family history of malignant neoplasm of breast: Secondary | ICD-10-CM

## 2024-01-05 LAB — GENETIC SCREENING ORDER

## 2024-01-05 NOTE — Progress Notes (Signed)
 REFERRING PROVIDER: Alger Gong, MD [address] [place],  [place] [address]  PRIMARY PROVIDER:  Pcp, No  PRIMARY REASON FOR VISIT:  1. Family history of breast cancer   2. Family history of malignant neoplasm of gastrointestinal tract     HISTORY OF PRESENT ILLNESS:   Elizabeth Hanson, a 28 y.o. female, was seen for a [place] cancer genetics consultation at the request of Constant, Peggy, MD due to a family history of cancer.  [name] presents to clinic today to discuss the possibility of a hereditary predisposition to cancer, to discuss genetic testing, and to further clarify her future cancer risks, as well as potential cancer risks for family members.   Elizabeth Hanson is a 28 y.o. female with no personal history of cancer.    RELEVANT MEDICAL HISTORY:  Menarche was at age 13.  Nulliparous. Trying to conceive for the last year.   Ovaries intact: yes.  Uterus intact: yes.  Menopausal status: premenopausal.  HRT use: 0 years. Colonoscopy: no; not examined. Mammogram within the last year: no. Has had breast U/S Number of breast biopsies: 0. Up to date with pelvic exams: yes.   Past Medical History:  Diagnosis Date   Acanthosis nigricans     No past surgical history on file.  Social History   Socioeconomic History   Marital status: Divorced    Spouse name: Not on file   Number of children: 0   Years of education: Not on file   Highest education level: Not on file  Occupational History   Not on file  Tobacco Use   Smoking status: Every Day   Smokeless tobacco: Never   Tobacco comments:    Black & milds  Vaping Use   Vaping status: Never Used  Substance and Sexual Activity   Alcohol use: Yes    Comment: occasionally   Drug use: Not Currently    Types: Marijuana   Sexual activity: Yes    Birth control/protection: None  Other Topics Concern   Not on file  Social History Narrative   Not on file   Social Drivers of Health   Financial Resource Strain:  Low Risk (11/05/2022)   Received from [facility]   Overall Financial Resource Strain (CARDIA)    Difficulty of Paying Living Expenses: Not hard at all  Food Insecurity: No Food Insecurity (11/07/2022)   Hunger Vital Sign    Worried About Running Out of Food in the Last Year: Never true    Ran Out of Food in the Last Year: Never true  Transportation Needs: No Transportation Needs (11/07/2022)   PRAPARE - Administrator, Civil Service (Medical): No    Lack of Transportation (Non-Medical): No  Physical Activity: Not on file  Stress: Not on file  Social Connections: Unknown (11/03/2022)   Received from [facility]   Social Network    Social Network: Not on file     FAMILY HISTORY:  We obtained a detailed, 4-generation family history.  Significant diagnoses are listed below: Family History  Problem Relation Age of Onset   Breast cancer Mother 71   Multiple myeloma Maternal Grandfather 54   Pancreatic cancer Paternal Grandmother 35    Elizabeth Hanson is unaware of previous family history of genetic testing for hereditary cancer risks There is no reported Ashkenazi Jewish ancestry.     GENETIC COUNSELING ASSESSMENT: Elizabeth Hanson is a 28 y.o. female with a family history of cancer which is somewhat suggestive of  a hereditary predisposition to cancer given the family history of a maternal first degree relative with breast cancer under 50 and a paternal second degree relative with pancreatic cancer. We, therefore, discussed and recommended the following at today's visit.   DISCUSSION: We discussed that 5 - 10% of cancer is hereditary, with most cases of hereditary breast cancer associated with pathogenic variants in BRCA1/2.  There are other genes that can be associated with hereditary breast cancer syndromes.  We discussed that testing is beneficial for several reasons, including knowing about other cancer risks, identifying potential screening and risk-reduction options that may be  appropriate, and to understanding if other family members could be at risk for cancer and allowing them to undergo genetic testing.  We reviewed the characteristics, features and inheritance patterns of hereditary cancer syndromes. We also discussed genetic testing, including the appropriate family members to test, the process of testing, insurance coverage and turn-around-time for results. We discussed the implications of a negative, positive, carrier and/or variant of uncertain significant result. We discussed that negative results would be uninformative given that Elizabeth Hanson does not have a personal history of cancer. We recommended Elizabeth Hanson pursue genetic testing for a panel that contains genes associated with breast cancer.  Elizabeth Hanson was offered a common hereditary cancer panel (40+ genes) and an expanded pan-cancer panel (70+ genes). Elizabeth Hanson was informed of the benefits and limitations of each panel, including that expanded pan-cancer panels contain several genes that do not have clear management guidelines at this point in time.  We also discussed that as the number of genes included on a panel increases, the chances of variants of uncertain significance increases.  After considering the benefits and limitations of each gene panel, Elizabeth Hanson elected to have [facility] CancerNext-Expanded +RNAinsight panel. The CancerNext-Expanded gene panel offered by [facility] and includes sequencing, rearrangement, and RNA analysis for the following 77 genes: AIP, ALK, APC, ATM, AXIN2, BAP1, BARD1, BMPR1A, BRCA1, BRCA2, BRIP1, CDC73, CDH1, CDK4, CDKN1B, CDKN2A, CEBPA, CHEK2, CTNNA1, DDX41, DICER1, EGFR, EPCAM, ETV6, FH, FLCN, GATA2, GREM1, HOXB13, KIT, LZTR1, MAX, MBD4, MEN1, MET, MITF, MLH1, MSH2, MSH3, MSH6, MUTYH, NF1, NF2, NTHL1, PALB2, PDGFRA, PHOX2B, PMS2, POLD1, POLE, POT1, PRKAR1A, PTCH1, PTEN, RAD51C, RAD51D, RB1, RET, RPS20, RUNX1, SDHA, SDHAF2, SDHB, SDHC, SDHD, SMAD4, SMARCA4, SMARCB1, SMARCE1, STK11, SUFU,  TMEM127, TP53, TSC1, TSC2, VHL, WT1.     Based on Elizabeth Hanson's family history of cancer, she meets medical criteria for genetic testing. Though Ms. Meenach is not personally affected, there are no affected family members that are willing/able to undergo hereditary cancer testing. Despite that she meets criteria, she may still have an out of pocket cost. Ms. Ellery completed the online application for [employer] Program, which confirmed she would qualify for testing at $0 out of pocket.   We discussed that some people do not want to undergo genetic testing due to fear of genetic discrimination.  A federal law called the Genetic Information Non-Discrimination Act (GINA) of 2008 helps protect individuals against genetic discrimination based on their genetic test results.  It impacts both health insurance and employment.  With health insurance, it protects against increased premiums, being kicked off insurance or being forced to take a test in order to be insured.  For employment it protects against hiring, firing and promoting decisions based on genetic test results.  GINA does not apply to those in the [employer], those who work for companies with less than 15 employees, and new life insurance  or long-term disability medical illustrator.  Health status due to a cancer diagnosis is not protected under GINA.  Will run breast cancer risk model if indicated based on results of genetic testing.   PLAN: After considering the risks, benefits, and limitations, Ms. Kinnamon provided informed consent to pursue genetic testing and the blood sample was sent to [facility] for analysis of the CancerNext-Expanded +RNAinsight. Results should be available within approximately 2-3 weeks' time, at which point they will be disclosed by telephone to Ms. Cecilio, as will any additional recommendations warranted by these results. Ms. Schwanz will receive a summary of her genetic counseling visit and a copy of her results once  available. This information will also be available in Epic.   Lastly, we encouraged Ms. Halbert to remain in contact with cancer genetics annually so that we can continuously update the family history and inform her of any changes in cancer genetics and testing that may be of benefit for this family.   Ms. Reever questions were answered to her satisfaction today. Our contact information was provided should additional questions or concerns arise. Thank you for the referral and allowing us  to share in the care of your patient.   Burnard Ogren, MS, [facility] Licensed, [email or web address] phone: [phone]   50 minutes were spent on the date of the encounter in service to the patient including preparation, face-to-face consultation, documentation and care coordination.  The patient was seen alone.  Drs. Gudena and/or Lanny were available to discuss this case as needed.  _______________________________________________________________________ For Office Staff:  Number of people involved in session: 1 Was an Intern/ student involved with case: no

## 2024-01-13 ENCOUNTER — Telehealth: Payer: Self-pay | Admitting: Genetic Counselor

## 2024-01-13 NOTE — Telephone Encounter (Signed)
 LVM asking for call back to review results of genetic testing.

## 2024-01-14 ENCOUNTER — Ambulatory Visit: Payer: Self-pay | Admitting: Genetic Counselor

## 2024-01-14 DIAGNOSIS — Z1379 Encounter for other screening for genetic and chromosomal anomalies: Secondary | ICD-10-CM

## 2024-01-14 NOTE — Telephone Encounter (Signed)
 I spoke to Elizabeth Hanson to review results of genetic testing, completed with [facility] CancerNext-Expanded +RNA. Testing did not identify any variants known to increase the risk for cancer.  Discussed that we do not know why there is cancer in the family. It is possible that the cancers in history occurred by chance or due to environmental factors. It is possible there are family members that have a variant that she did not inherit. It is also possible there is a hereditary cause for cancer that cannot be identified with current technology or due to a gene that was not tested. It will be important for her to keep in contact with genetics to keep up with whether additional testing may be needed.  Lifetime breast cancer risk calculated at 23% based on [employer]. No insurance, will share with [facility] to help with appropriate breast cancer surveillance. Additionally, genetic testing for her mother could help clarify risk.   Please see counseling note for further detail on this result.

## 2024-01-15 DIAGNOSIS — Z1379 Encounter for other screening for genetic and chromosomal anomalies: Secondary | ICD-10-CM | POA: Insufficient documentation

## 2024-01-15 NOTE — Progress Notes (Signed)
 HPI:  Ms. Zech was previously seen in the [facility] due to a family history of cancer and concerns regarding a hereditary predisposition to cancer. Please refer to our prior cancer genetics clinic note for more information regarding our discussion, assessment and recommendations, at the time. Ms. Buczek recent genetic test results were disclosed to her, as were recommendations warranted by these results. These results and recommendations are discussed in more detail below.  Results were disclosed via telephone on 01/14/24.   FAMILY HISTORY:  We obtained a detailed, 4-generation family history.  Significant diagnoses are listed below: Family History  Problem Relation Age of Onset   Breast cancer Mother 66   Multiple myeloma Maternal Grandfather 7   Pancreatic cancer Paternal Grandmother 67      GENETIC TEST RESULTS: Genetic testing reported out on 01/12/24 through the [facility] CancerNext-Expanded +RNAinsight panel found no pathogenic mutations. The CancerNext-Expanded gene panel offered by [facility] and includes sequencing, rearrangement, and RNA analysis for the following 77 genes: AIP, ALK, APC, ATM, AXIN2, BAP1, BARD1, BMPR1A, BRCA1, BRCA2, BRIP1, CDC73, CDH1, CDK4, CDKN1B, CDKN2A, CEBPA, CHEK2, CTNNA1, DDX41, DICER1, EGFR, EPCAM, ETV6, FH, FLCN, GATA2, GREM1, HOXB13, KIT, LZTR1, MAX, MBD4, MEN1, MET, MITF, MLH1, MSH2, MSH3, MSH6, MUTYH, NF1, NF2, NTHL1, PALB2, PDGFRA, PHOX2B, PMS2, POLD1, POLE, POT1, PRKAR1A, PTCH1, PTEN, RAD51C, RAD51D, RB1, RET, RPS20, RUNX1, SDHA, SDHAF2, SDHB, SDHC, SDHD, SMAD4, SMARCA4, SMARCB1, SMARCE1, STK11, SUFU, TMEM127, TP53, TSC1, TSC2, VHL, WT1. The test report has been scanned into EPIC and is located under the Molecular Pathology section of the Results Review tab.  A portion of the result report is included below for reference.     We discussed with Ms. Woolworth that because current genetic testing is not perfect, it is possible there may be  a gene mutation in one of these genes that current testing cannot detect, but that chance is small.  We also discussed, that there could be another gene that has not yet been discovered, or that we have not yet tested, that is responsible for the cancer diagnoses in the family. It is also possible there is a hereditary cause for the cancer in the family that Ms. Fasnacht did not inherit and therefore was not identified in her testing.  Therefore, it is important to remain in touch with cancer genetics in the future so that we can continue to offer Ms. Delao the most up to date genetic testing.   ADDITIONAL GENETIC TESTING: We discussed with Ms. Gueye that her genetic testing was fairly extensive.  If there are genes identified to increase cancer risk that can be analyzed in the future, we would be happy to discuss and coordinate this testing at that time.    CANCER SCREENING RECOMMENDATIONS: Ms. Worm test result is considered negative (normal).  This means that we have not identified a hereditary cause for her family history of cancer at this time. Most cancers happen by chance and this negative test suggests that her family history of cancer may fall into this category.    Possible reasons for Ms. Heinbaugh's negative genetic test include:  1. There may be a gene mutation in one of these genes that current testing methods cannot detect but that chance is small.  2. There could be another gene that has not yet been discovered, or that we have not yet tested, that is responsible for the cancer diagnoses in the family.  3.  There may be no hereditary risk for cancer in  the family. The cancers in Ms. Laboy and/or her family may be sporadic/familial or due to other genetic and environmental factors. 4. It is also possible there is a hereditary cause for the cancer in the family that Ms. Monforte did not inherit.  Therefore, it is recommended she continue to follow the cancer management and screening guidelines provided by her  primary healthcare provider. An individual's cancer risk and medical management are not determined by genetic test results alone. Overall cancer risk assessment incorporates additional factors, including personal medical history, family history, and any available genetic information that may result in a personalized plan for cancer prevention and surveillance  Given Ms. Prime's family history, we must interpret these negative results with some caution.  Families with features suggestive of hereditary risk for cancer tend to have multiple family members with cancer, diagnoses in multiple generations and diagnoses before the age of 50. Ms. Little family exhibits some of these features. Thus, this result may simply reflect our current inability to detect all mutations within these genes or there may be a different gene that has not yet been discovered or tested.   Ms. Vessey has been determined to be at high risk for breast cancer. her Tyrer-Cuzick risk score is 23.4%.  For women with a greater than 20% lifetime risk of breast cancer, the [employer] (NCCN) recommends the following:   Clinical encounter every 6-12 months to begin when identified as being at increased risk, but not before age 54  Annual mammograms. Tomosynthesis is recommended starting 10 years earlier than the youngest breast cancer diagnosis in the family or at age 59 (whichever comes first), but not before age 93   Annual breast MRI starting 10 years earlier than the youngest breast cancer diagnosis in the family or at age 67 (whichever comes first), but not before age 84.   We, therefore, discussed that it is reasonable for Ms. Hink to be followed by a high-risk breast cancer clinic; in addition to a yearly mammogram and physical exam by a healthcare provider, she should discuss the usefulness of an annual breast MRI with the high-risk clinic providers. She plans to follow up with the [facility] team about this and will notify  our office if interested in   RECOMMENDATIONS FOR FAMILY MEMBERS:  Individuals in this family might be at some increased risk of developing cancer, over the general population risk, simply due to the family history of cancer.  We recommended women in this family have a yearly mammogram beginning at age 72, or 38 years younger than the earliest onset of cancer, an annual clinical breast exam, and perform monthly breast self-exams. Women in this family should also have a gynecological exam as recommended by their primary provider. All family members should be referred for colonoscopy starting at age 47, or 31 years younger than the earliest onset of cancer.  It is also possible there is a hereditary cause for the cancer in Ms. Stingley's family that she did not inherit and therefore was not identified in her.  Based on Ms. Mills's family history, we recommended her mother, who was diagnosed with breast cancer at age 56, have genetic counseling and testing. Ms. Boston will let us  know if we can be of any assistance in coordinating genetic counseling and/or testing for this family member.   FOLLOW-UP: Lastly, we discussed with Ms. Magana that cancer genetics is a rapidly advancing field and it is possible that new genetic tests will be appropriate for her  and/or her family members in the future. We encouraged her to remain in contact with cancer genetics on an annual basis so we can update her personal and family histories and let her know of advances in cancer genetics that may benefit this family.   Our contact number was provided. Ms. Pytel questions were answered to her satisfaction, and she knows she is welcome to call us  at anytime with additional questions or concerns.   Burnard Ogren, MS, [facility] Licensed, [email or web address] [phone]

## 2024-02-10 ENCOUNTER — Telehealth: Payer: Self-pay

## 2024-02-10 NOTE — Telephone Encounter (Signed)
 A message was received referring pt back to [facility] for a screening mammogram. I have review pts chart and spoken with the pt and advised that she is to start her screening mammograms at age 29 (10 years before her mother was dx with [facility]). Pt will be 29yo December 20[birth year] and understands that as long has she qualifies for [facility], we are happy pt help with her mammogram coverage once she is age 18. Pt expressed understanding of this information.
